# Patient Record
Sex: Female | Born: 1955 | Race: White | Hispanic: No | Smoking: Never smoker
Health system: Southern US, Community
[De-identification: ages and names within clinical notes are randomized; demographics above are authoritative.]

## PROBLEM LIST (undated history)

## (undated) DIAGNOSIS — F411 Generalized anxiety disorder: Secondary | ICD-10-CM

## (undated) DIAGNOSIS — F419 Anxiety disorder, unspecified: Secondary | ICD-10-CM

## (undated) DIAGNOSIS — L409 Psoriasis, unspecified: Secondary | ICD-10-CM

## (undated) DIAGNOSIS — Z789 Other specified health status: Secondary | ICD-10-CM

## (undated) DIAGNOSIS — M1990 Unspecified osteoarthritis, unspecified site: Secondary | ICD-10-CM

## (undated) DIAGNOSIS — T7840XA Allergy, unspecified, initial encounter: Secondary | ICD-10-CM

## (undated) DIAGNOSIS — F329 Major depressive disorder, single episode, unspecified: Secondary | ICD-10-CM

## (undated) DIAGNOSIS — K589 Irritable bowel syndrome without diarrhea: Secondary | ICD-10-CM

## (undated) DIAGNOSIS — E785 Hyperlipidemia, unspecified: Secondary | ICD-10-CM

## (undated) DIAGNOSIS — Z5189 Encounter for other specified aftercare: Secondary | ICD-10-CM

## (undated) HISTORY — DX: Allergy, unspecified, initial encounter: T78.40XA

## (undated) HISTORY — DX: Irritable bowel syndrome, unspecified: K58.9

## (undated) HISTORY — DX: Unspecified osteoarthritis, unspecified site: M19.90

## (undated) HISTORY — DX: Major depressive disorder, single episode, unspecified: F32.9

## (undated) HISTORY — DX: Generalized anxiety disorder: F41.1

## (undated) HISTORY — DX: Anxiety disorder, unspecified: F41.9

## (undated) HISTORY — PX: DILATION AND CURETTAGE OF UTERUS: SHX78

## (undated) HISTORY — DX: Hyperlipidemia, unspecified: E78.5

## (undated) HISTORY — DX: Psoriasis, unspecified: L40.9

## (undated) HISTORY — DX: Encounter for other specified aftercare: Z51.89

---

## 2006-03-19 ENCOUNTER — Ambulatory Visit (HOSPITAL_BASED_OUTPATIENT_CLINIC_OR_DEPARTMENT_OTHER): Admission: RE | Admit: 2006-03-19 | Discharge: 2006-03-19 | Payer: Self-pay | Admitting: Orthopedic Surgery

## 2011-09-08 ENCOUNTER — Encounter (HOSPITAL_BASED_OUTPATIENT_CLINIC_OR_DEPARTMENT_OTHER): Payer: Self-pay | Admitting: *Deleted

## 2011-09-10 ENCOUNTER — Other Ambulatory Visit: Payer: Self-pay | Admitting: Orthopedic Surgery

## 2011-09-10 NOTE — H&P (Signed)
  [name] [name] is an 56 y.o. female.   Chief Complaint: c/o mass palm right hand. 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 She has had a slightly discolored reddish purple mass in the subcutaneous region in the mid carpal space that has been present for more than 5 years. This has gradually enlarged and now is extremely uncomfortable and intolerable at times due to a sense of itching. She vigorously tries to rub her palm to try and relieve her symptoms.     History reviewed. No pertinent past medical history.  Past Surgical History  Procedure Date  . Dilation and curettage of uterus     History reviewed. No pertinent family history. Social History:  reports that she has never smoked. She does not have any smokeless tobacco history on file. She reports that she drinks alcohol. She reports that she does not use illicit drugs.  Allergies: No Known Allergies  No current facility-administered medications on file as of .   No current outpatient prescriptions on file as of .    No results found for this or any previous visit (from the past 48 hour(s)).  No results found.   Pertinent items are noted in HPI.  Height 5\' 2"  (1.575 m), weight 63.504 kg (140 lb).  General appearance: alert, cooperative, appears stated age and no distress Head: atraumatic Neck: no adenopathy, no carotid bruit, no JVD, supple, symmetrical, trachea midline and thyroid not enlarged, symmetric, no tenderness/mass/nodules Resp: clear to auscultation bilaterally Cardio: regular rate and rhythm, S1, S2 normal, no murmur, click, rub or gallop GI: normal findings: bowel sounds normal Extremities:. Inspection of her hand reveals a 1 cm in diameter purplish red mass in the mid palmar space. 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    She had an MRI study with contrast at [facility]. Dr. Vear Clock reviewed the  films and was noncommittal with respect to diagnosis. She noted evidence of tenosynovitis in the hand flexor tendons and focal subcutaneous tissue bulge in the region of the marker without underlying discrete mass.  Pulses: 2+ and symmetric Skin: Skin color, texture, turgor normal. No rashes or lesions Neurologic: Grossly normal   Assessment/Plan Impression: Undefined mass right palm.  We will schedule her for incisional biopsy of a hemangioma and/or glomus tumor from this region. The surgery, after care, risks and benefits were described in detail. While we cannot guarantee any particular outcome with a palpable mass in my judgment we have clear indication to proceed with a biopsy at this time. Questions were invited and answered in detail.     [name],[name] Sun J 09/10/2011, 10:16 AM  H&P documentation: 09/11/2011  -History and Physical Reviewed  -Patient has been re-examined  -No change in the plan of care  Wyn Forster, MD

## 2011-09-11 ENCOUNTER — Ambulatory Visit (HOSPITAL_BASED_OUTPATIENT_CLINIC_OR_DEPARTMENT_OTHER): Payer: BC Managed Care – PPO | Admitting: Anesthesiology

## 2011-09-11 ENCOUNTER — Ambulatory Visit (HOSPITAL_BASED_OUTPATIENT_CLINIC_OR_DEPARTMENT_OTHER)
Admission: RE | Admit: 2011-09-11 | Discharge: 2011-09-11 | Disposition: A | Discharge status: Home / Self Care | Payer: BC Managed Care – PPO | Source: Ambulatory Visit | Attending: Orthopedic Surgery | Admitting: Orthopedic Surgery

## 2011-09-11 ENCOUNTER — Encounter (HOSPITAL_BASED_OUTPATIENT_CLINIC_OR_DEPARTMENT_OTHER): Payer: Self-pay | Admitting: *Deleted

## 2011-09-11 ENCOUNTER — Encounter (HOSPITAL_BASED_OUTPATIENT_CLINIC_OR_DEPARTMENT_OTHER)
Admission: RE | Disposition: A | Discharge status: Home / Self Care | Payer: Self-pay | Source: Ambulatory Visit | Attending: Orthopedic Surgery

## 2011-09-11 ENCOUNTER — Encounter (HOSPITAL_BASED_OUTPATIENT_CLINIC_OR_DEPARTMENT_OTHER): Payer: Self-pay | Admitting: Anesthesiology

## 2011-09-11 DIAGNOSIS — R229 Localized swelling, mass and lump, unspecified: Secondary | ICD-10-CM | POA: Insufficient documentation

## 2011-09-11 DIAGNOSIS — L299 Pruritus, unspecified: Secondary | ICD-10-CM | POA: Insufficient documentation

## 2011-09-11 HISTORY — PX: MASS EXCISION: SHX2000

## 2011-09-11 HISTORY — DX: Other specified health status: Z78.9

## 2011-09-11 SURGERY — EXCISION MASS
Anesthesia: Regional | Site: Hand | Laterality: Right | Wound class: Clean

## 2011-09-11 MED ORDER — ONDANSETRON HCL 4 MG/2ML IJ SOLN: 4.0000 mg | Freq: Four times a day (QID) | INTRAMUSCULAR | Status: DC | PRN

## 2011-09-11 MED ORDER — LIDOCAINE HCL (PF) 2 % IJ SOLN
INTRAMUSCULAR | Status: DC | PRN
  Administered 2011-09-11: 3 mL

## 2011-09-11 MED ORDER — FENTANYL CITRATE 0.05 MG/ML IJ SOLN
INTRAMUSCULAR | Status: DC | PRN
  Administered 2011-09-11: 50 ug via INTRAVENOUS
  Administered 2011-09-11: 25 ug via INTRAVENOUS

## 2011-09-11 MED ORDER — CHLORHEXIDINE GLUCONATE 4 % EX LIQD: 60.0000 mL | Freq: Once | CUTANEOUS | Status: DC

## 2011-09-11 MED ORDER — DEXAMETHASONE SODIUM PHOSPHATE 4 MG/ML IJ SOLN
INTRAMUSCULAR | Status: DC | PRN
  Administered 2011-09-11: 10 mg via INTRAVENOUS

## 2011-09-11 MED ORDER — HYDROMORPHONE HCL PF 1 MG/ML IJ SOLN: 0.2500 mg | INTRAMUSCULAR | Status: DC | PRN

## 2011-09-11 MED ORDER — LACTATED RINGERS IV SOLN
INTRAVENOUS | Status: DC
  Administered 2011-09-11: 08:00:00 via INTRAVENOUS

## 2011-09-11 MED ORDER — HYDROCODONE-ACETAMINOPHEN 5-500 MG PO TABS: 1.0000 | ORAL_TABLET | Freq: Four times a day (QID) | ORAL | Status: AC | PRN

## 2011-09-11 MED ORDER — MIDAZOLAM HCL 5 MG/5ML IJ SOLN
INTRAMUSCULAR | Status: DC | PRN
  Administered 2011-09-11: 1 mg via INTRAVENOUS

## 2011-09-11 MED ORDER — PROPOFOL 10 MG/ML IV EMUL
INTRAVENOUS | Status: DC | PRN
  Administered 2011-09-11: 200 mg via INTRAVENOUS

## 2011-09-11 MED ORDER — ONDANSETRON HCL 4 MG/2ML IJ SOLN
INTRAMUSCULAR | Status: DC | PRN
  Administered 2011-09-11: 4 mg via INTRAVENOUS

## 2011-09-11 MED ORDER — LIDOCAINE HCL (CARDIAC) 20 MG/ML IV SOLN
INTRAVENOUS | Status: DC | PRN
  Administered 2011-09-11: 100 mg via INTRAVENOUS

## 2011-09-11 SURGICAL SUPPLY — 53 items
BANDAGE ADHESIVE 1X3 (GAUZE/BANDAGES/DRESSINGS) IMPLANT
BANDAGE ELASTIC 3 VELCRO ST LF (GAUZE/BANDAGES/DRESSINGS) ×1 IMPLANT
BLADE MINI RND TIP GREEN BEAV (BLADE) IMPLANT
BLADE SURG 15 STRL LF DISP TIS (BLADE) ×1 IMPLANT
BLADE SURG 15 STRL SS (BLADE) ×2
BNDG CMPR 9X4 STRL LF SNTH (GAUZE/BANDAGES/DRESSINGS) ×1
BNDG CMPR MD 5X2 ELC HKLP STRL (GAUZE/BANDAGES/DRESSINGS) ×1
BNDG COHESIVE 1X5 TAN STRL LF (GAUZE/BANDAGES/DRESSINGS) ×1 IMPLANT
BNDG ELASTIC 2 VLCR STRL LF (GAUZE/BANDAGES/DRESSINGS) ×1 IMPLANT
BNDG ESMARK 4X9 LF (GAUZE/BANDAGES/DRESSINGS) ×1 IMPLANT
BRUSH SCRUB EZ PLAIN DRY (MISCELLANEOUS) ×2 IMPLANT
CLOTH BEACON ORANGE TIMEOUT ST (SAFETY) ×2 IMPLANT
CORDS BIPOLAR (ELECTRODE) ×2 IMPLANT
COVER MAYO STAND STRL (DRAPES) ×2 IMPLANT
COVER TABLE BACK 60X90 (DRAPES) ×2 IMPLANT
CUFF TOURNIQUET SINGLE 18IN (TOURNIQUET CUFF) ×1 IMPLANT
DECANTER SPIKE VIAL GLASS SM (MISCELLANEOUS) ×2 IMPLANT
DRAIN PENROSE 1/2X12 LTX STRL (WOUND CARE) IMPLANT
DRAIN PENROSE 1/4X12 LTX STRL (WOUND CARE) IMPLANT
DRAPE EXTREMITY T 121X128X90 (DRAPE) ×2 IMPLANT
DRAPE SURG 17X23 STRL (DRAPES) ×2 IMPLANT
GAUZE XEROFORM 1X8 LF (GAUZE/BANDAGES/DRESSINGS) ×1 IMPLANT
GLOVE BIO SURGEON STRL SZ7 (GLOVE) ×2 IMPLANT
GLOVE BIOGEL PI IND STRL 7.0 (GLOVE) IMPLANT
GLOVE BIOGEL PI IND STRL 8 (GLOVE) ×1 IMPLANT
GLOVE BIOGEL PI INDICATOR 7.0 (GLOVE) ×1
GLOVE BIOGEL PI INDICATOR 8 (GLOVE) ×1
GLOVE ORTHO TXT STRL SZ7.5 (GLOVE) ×2 IMPLANT
GOWN PREVENTION PLUS XLARGE (GOWN DISPOSABLE) ×2 IMPLANT
GOWN STRL REIN XL XLG (GOWN DISPOSABLE) ×4 IMPLANT
NDL BLUNT 17GA (NEEDLE) ×1 IMPLANT
NEEDLE 27GAX1X1/2 (NEEDLE) ×1 IMPLANT
NEEDLE BLUNT 17GA (NEEDLE) IMPLANT
PACK BASIN DAY SURGERY FS (CUSTOM PROCEDURE TRAY) ×2 IMPLANT
PAD CAST 3X4 CTTN HI CHSV (CAST SUPPLIES) ×1 IMPLANT
PADDING CAST COTTON 3X4 STRL (CAST SUPPLIES) ×2
PADDING UNDERCAST 2  STERILE (CAST SUPPLIES) IMPLANT
SPLINT PLASTER CAST XFAST 3X15 (CAST SUPPLIES) IMPLANT
SPLINT PLASTER XTRA FASTSET 3X (CAST SUPPLIES)
SPONGE GAUZE 4X4 12PLY (GAUZE/BANDAGES/DRESSINGS) IMPLANT
STOCKINETTE 4X48 STRL (DRAPES) ×2 IMPLANT
STRIP CLOSURE SKIN 1/2X4 (GAUZE/BANDAGES/DRESSINGS) IMPLANT
SUT ETHILON 5 0 P 3 18 (SUTURE) ×1
SUT MERSILENE 4 0 P 3 (SUTURE) IMPLANT
SUT NYLON ETHILON 5-0 P-3 1X18 (SUTURE) ×1 IMPLANT
SUT PROLENE 3 0 PS 2 (SUTURE) IMPLANT
SYR 20CC LL (SYRINGE) IMPLANT
SYR 3ML 23GX1 SAFETY (SYRINGE) IMPLANT
SYR CONTROL 10ML LL (SYRINGE) ×1 IMPLANT
TOWEL OR 17X24 6PK STRL BLUE (TOWEL DISPOSABLE) ×3 IMPLANT
TRAY DSU PREP LF (CUSTOM PROCEDURE TRAY) ×2 IMPLANT
UNDERPAD 30X30 INCONTINENT (UNDERPADS AND DIAPERS) ×2 IMPLANT
WATER STERILE IRR 1000ML POUR (IV SOLUTION) IMPLANT

## 2011-09-11 NOTE — Brief Op Note (Signed)
09/11/2011  8:20 AM  PATIENT:  [name] [name]  56 y.o. female  PRE-OPERATIVE DIAGNOSIS:  lump in right palm, vascular and intensely puritic  POST-OPERATIVE DIAGNOSIS:  lump in right palm, rule out glomus tumor variant  PROCEDURE:  EXCISIONAL BIOPSY VASCULAR LESION RIGHT PALM   SURGEON:  Wyn Forster., MD   PHYSICIAN ASSISTANT:   ASSISTANTS: none   ANESTHESIA:   general  EBL:     BLOOD ADMINISTERED:none  DRAINS: none   LOCAL MEDICATIONS USED:  XYLOCAINE   SPECIMEN:  Excision  DISPOSITION OF SPECIMEN:  PATHOLOGY  COUNTS:  YES  TOURNIQUET:   Total Tourniquet Time Documented: Upper Arm (Right) - 22 minutes  DICTATION: .Other Dictation: Dictation Number [id number]  PLAN OF CARE: Discharge to home after PACU  PATIENT DISPOSITION:  PACU - hemodynamically stable.

## 2011-09-11 NOTE — Anesthesia Postprocedure Evaluation (Signed)
Anesthesia Post Note  Patient: [name] [name]  Procedure(s) Performed: Procedure(s) (LRB): EXCISION MASS (Right)  Anesthesia type: General  Patient location: PACU  Post pain: Pain level controlled and Adequate analgesia  Post assessment: Post-op Vital signs reviewed, Patient's Cardiovascular Status Stable, Respiratory Function Stable, Patent Airway and Pain level controlled  Last Vitals:  Filed Vitals:   09/11/11 0830  BP: 143/80  Pulse: 71  Temp:   Resp: 13    Post vital signs: Reviewed and stable  Level of consciousness: awake, alert  and oriented  Complications: No apparent anesthesia complications

## 2011-09-11 NOTE — Discharge Instructions (Addendum)

## 2011-09-11 NOTE — Op Note (Signed)
OP NOTE DICTATED 09/11/11 [id number]

## 2011-09-11 NOTE — Anesthesia Procedure Notes (Signed)
Procedure Name: LMA Insertion Date/Time: 09/11/2011 7:45 AM Performed by: Gar Gibbon Pre-anesthesia Checklist: Patient identified, Emergency Drugs available, Suction available and Patient being monitored Patient Re-evaluated:Patient Re-evaluated prior to inductionOxygen Delivery Method: Circle system utilized Preoxygenation: Pre-oxygenation with 100% oxygen Intubation Type: IV induction LMA: LMA inserted LMA Size: 4.0 Number of attempts: 1 Tube secured with: Tape Dental Injury: Teeth and Oropharynx as per pre-operative assessment

## 2011-09-11 NOTE — Transfer of Care (Signed)
Immediate Anesthesia Transfer of Care Note  Patient: [name] [name]  Procedure(s) Performed: Procedure(s) (LRB): EXCISION MASS (Right)  Patient Location: PACU  Anesthesia Type: General  Level of Consciousness: alert   Airway & Oxygen Therapy: Patient Spontanous Breathing  Post-op Assessment: Report given to PACU RN  Post vital signs: Reviewed and stable  Complications: No apparent anesthesia complications

## 2011-09-11 NOTE — Anesthesia Preprocedure Evaluation (Signed)
Anesthesia Evaluation  Patient identified by MRN, date of birth, ID band Patient awake    Reviewed: Allergy & Precautions, H&P , NPO status , Patient's Chart, lab work & pertinent test results  Airway Mallampati: II  Neck ROM: full    Dental   Pulmonary          Cardiovascular     Neuro/Psych    GI/Hepatic   Endo/Other    Renal/GU      Musculoskeletal   Abdominal   Peds  Hematology   Anesthesia Other Findings   Reproductive/Obstetrics                           Anesthesia Physical Anesthesia Plan  ASA: I  Anesthesia Plan: Bier Block   Post-op Pain Management:    Induction:   Airway Management Planned:   Additional Equipment:   Intra-op Plan:   Post-operative Plan:   Informed Consent: I have reviewed the patients History and Physical, chart, labs and discussed the procedure including the risks, benefits and alternatives for the proposed anesthesia with the patient or authorized representative who has indicated his/her understanding and acceptance.     Plan Discussed with: CRNA and Surgeon  Anesthesia Plan Comments:         Anesthesia Quick Evaluation

## 2011-09-11 NOTE — Op Note (Signed)
[name], [name]                 ACCOUNT NO.:  [id number]  MEDICAL RECORD NO.:  [id number]  LOCATION:                                 FACILITY:  PHYSICIAN:  Katy Fitch. Vaudie Engebretsen, M.D.      DATE OF BIRTH:  DATE OF PROCEDURE:  09/11/2011 DATE OF DISCHARGE:                              OPERATIVE REPORT   PREOPERATIVE DIAGNOSIS:  Intensely pruritic mass, right hand mid palm, deep to palmar fascia, with a negative MRI contrast study preoperatively and consistent physical findings for more than 3 months.  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  (The mass was sent in formalin and labeled with a suture for pathologic evaluation).  OPERATING SURGEON:  Katy Fitch. Oluwadamilare Tobler, MD  No assistant.  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  She presented for evaluation of a very bothersome right palm predicament several months prior.  She had an area of firmness in her palm that at times had a bluish red cast on direct inspection.  On palpation, it was quite sensitive to touch and caused intense itching.  Clinical examination was challenging.  There was a vague sense of fullness that appeared to be deep to the palmar fascia.  With dependency, this did not substantially change, it was not pulsatile. She had no history of penetrating foreign body.  Radiographs were unrevealing.  Due to the intensity of her symptoms, we recommended an MRI with contrast looking for possible glomus tumor.  This was accomplished at [facility]; however, it was felt to be nondiagnostic by the attending radiologist.  I reviewed the MRI and felt that there was an area of hypervascularity that was quite small, deep to  the fascia.  After detailed informed consent, we advised [name] [name], we would be able to explore this area but did not guarantee any specific outcome.  In the absence of an imaging study to guide [place], a biopsy in this region is done as an act of faith with a therapeutic alliance with our patient.  She requested that we proceed with exploration of her palm at this time and appropriate intervention as our findings dictate.  Preoperatively, she was interviewed by Dr. Chaney Malling.  IV access was attempted.  This was a bit challenging due to her being apprehensive with high catecholamines.  She had a very traumatic experience having an IV started, which could be a prognostic sign.  After informed consent, she was brought to the operating room at this time.  PROCEDURE:  [name] [name] was brought to room 6 of the [facility] and placed in supine position on the operating table.  Following the induction of general anesthesia, under Dr. Seward Meth direct supervision, the right arm was prepped with Betadine soap solution, sterilely draped.  A pneumatic tourniquet was applied to proximal right brachium.  Prophylactic antibiotics were deferred.  After routine surgical time-out, the right hand and arm were exsanguinated  with an Esmarch bandage and arterial tourniquet was inflated 220 mmHg. The operative site had been meticulously marked in the holding area preoperatively.  We created an elliptical incision in the thenar crease and elevated the skin over the area of the mass.  Subcutaneous tissues were unremarkable.  The palmar fascia was slightly hypertrophic but otherwise normal.  The palmar fascia and the subdermal tissues in this region were excised so as to possibly remove any small neuroma that could have been created from prior trauma.  With careful dissection of the mid palmar fat, we identified a 3 x 3-mm, highly vascular, pinkish orange lesion that had a feeding vessel and  a small nerve attached to it.  We also found 1 rather large pacinian corpuscle.  Both were excised.  The vascular mass was marked with a 5-0 nylon suture and placed in formalin.  The wound was meticulously inspected.  We examined the palmar arch.  The common digital vessels to the index, long, and ring fingers, and the region of the common digital nerves.  We could not identify any other lesions.  The wound was then inspected for bleeding points which were electrocauterized with bipolar current, followed by repair of the skin with trauma sutures of 5-0 nylon.  The wound was dressed with a Xeroflo sterile gauze, and an Ace bandage dressing and 3 mL of 2% lidocaine were infiltrated around the wound for postoperative analgesia.  There were no apparent complications.  For aftercare, [name] [name] was transferred to the post anesthesia care unit.  She will be discharged to the care of her family with a prescription for Vicodin 5 mg 1 p.o. q.4-6 hours p.r.n. pain, 20 tablets, without refill.  We will see her back for followup in our office in 1 week.     [name], M.D.    RVS/MEDQ  D:  09/11/2011  T:  09/11/2011  Job:  [id number]

## 2011-09-15 ENCOUNTER — Encounter (HOSPITAL_BASED_OUTPATIENT_CLINIC_OR_DEPARTMENT_OTHER): Payer: Self-pay | Admitting: Orthopedic Surgery

## 2012-08-02 DIAGNOSIS — L719 Rosacea, unspecified: Secondary | ICD-10-CM | POA: Insufficient documentation

## 2012-08-16 DIAGNOSIS — J209 Acute bronchitis, unspecified: Secondary | ICD-10-CM | POA: Insufficient documentation

## 2014-01-28 DIAGNOSIS — M21969 Unspecified acquired deformity of unspecified lower leg: Secondary | ICD-10-CM | POA: Insufficient documentation

## 2014-01-28 DIAGNOSIS — N951 Menopausal and female climacteric states: Secondary | ICD-10-CM | POA: Insufficient documentation

## 2014-01-28 DIAGNOSIS — M7750 Other enthesopathy of unspecified foot: Secondary | ICD-10-CM | POA: Insufficient documentation

## 2014-01-28 DIAGNOSIS — K649 Unspecified hemorrhoids: Secondary | ICD-10-CM | POA: Insufficient documentation

## 2014-02-17 DIAGNOSIS — R059 Cough, unspecified: Secondary | ICD-10-CM | POA: Insufficient documentation

## 2014-10-12 DIAGNOSIS — R143 Flatulence: Secondary | ICD-10-CM | POA: Insufficient documentation

## 2014-10-12 DIAGNOSIS — G549 Nerve root and plexus disorder, unspecified: Secondary | ICD-10-CM | POA: Insufficient documentation

## 2014-11-09 DIAGNOSIS — H6693 Otitis media, unspecified, bilateral: Secondary | ICD-10-CM | POA: Insufficient documentation

## 2014-12-25 DIAGNOSIS — G5601 Carpal tunnel syndrome, right upper limb: Secondary | ICD-10-CM | POA: Insufficient documentation

## 2014-12-25 DIAGNOSIS — G5622 Lesion of ulnar nerve, left upper limb: Secondary | ICD-10-CM | POA: Insufficient documentation

## 2015-04-05 DIAGNOSIS — L989 Disorder of the skin and subcutaneous tissue, unspecified: Secondary | ICD-10-CM | POA: Insufficient documentation

## 2015-04-05 DIAGNOSIS — L209 Atopic dermatitis, unspecified: Secondary | ICD-10-CM | POA: Insufficient documentation

## 2015-06-22 DIAGNOSIS — L03011 Cellulitis of right finger: Secondary | ICD-10-CM | POA: Insufficient documentation

## 2015-06-22 DIAGNOSIS — M79644 Pain in right finger(s): Secondary | ICD-10-CM | POA: Insufficient documentation

## 2015-07-19 DIAGNOSIS — M25511 Pain in right shoulder: Secondary | ICD-10-CM | POA: Insufficient documentation

## 2015-11-09 DIAGNOSIS — R030 Elevated blood-pressure reading, without diagnosis of hypertension: Secondary | ICD-10-CM | POA: Insufficient documentation

## 2015-12-05 DIAGNOSIS — M542 Cervicalgia: Secondary | ICD-10-CM | POA: Insufficient documentation

## 2015-12-05 DIAGNOSIS — M545 Low back pain, unspecified: Secondary | ICD-10-CM | POA: Insufficient documentation

## 2015-12-27 DIAGNOSIS — J00 Acute nasopharyngitis [common cold]: Secondary | ICD-10-CM | POA: Insufficient documentation

## 2016-08-29 DIAGNOSIS — J301 Allergic rhinitis due to pollen: Secondary | ICD-10-CM | POA: Insufficient documentation

## 2016-08-29 DIAGNOSIS — J3081 Allergic rhinitis due to animal (cat) (dog) hair and dander: Secondary | ICD-10-CM | POA: Insufficient documentation

## 2016-12-09 DIAGNOSIS — S62627A Displaced fracture of medial phalanx of left little finger, initial encounter for closed fracture: Secondary | ICD-10-CM | POA: Insufficient documentation

## 2017-02-23 DIAGNOSIS — R1084 Generalized abdominal pain: Secondary | ICD-10-CM | POA: Insufficient documentation

## 2017-03-06 DIAGNOSIS — B9681 Helicobacter pylori [H. pylori] as the cause of diseases classified elsewhere: Secondary | ICD-10-CM | POA: Insufficient documentation

## 2017-03-06 DIAGNOSIS — R1013 Epigastric pain: Secondary | ICD-10-CM | POA: Insufficient documentation

## 2017-04-22 HISTORY — PX: ESOPHAGOGASTRODUODENOSCOPY: SHX1529

## 2017-07-29 DIAGNOSIS — R0981 Nasal congestion: Secondary | ICD-10-CM | POA: Insufficient documentation

## 2017-07-29 DIAGNOSIS — R519 Headache, unspecified: Secondary | ICD-10-CM | POA: Insufficient documentation

## 2017-08-25 DIAGNOSIS — M722 Plantar fascial fibromatosis: Secondary | ICD-10-CM | POA: Insufficient documentation

## 2017-08-25 DIAGNOSIS — M7731 Calcaneal spur, right foot: Secondary | ICD-10-CM | POA: Insufficient documentation

## 2017-08-25 DIAGNOSIS — M6701 Short Achilles tendon (acquired), right ankle: Secondary | ICD-10-CM | POA: Insufficient documentation

## 2017-09-08 ENCOUNTER — Encounter: Payer: Self-pay | Admitting: Gastroenterology

## 2017-10-22 ENCOUNTER — Encounter: Payer: Self-pay | Admitting: Gastroenterology

## 2017-10-27 ENCOUNTER — Ambulatory Visit (INDEPENDENT_AMBULATORY_CARE_PROVIDER_SITE_OTHER): Payer: BLUE CROSS/BLUE SHIELD | Admitting: Gastroenterology

## 2017-10-27 ENCOUNTER — Telehealth: Payer: Self-pay

## 2017-10-27 ENCOUNTER — Encounter: Payer: Self-pay | Admitting: Gastroenterology

## 2017-10-27 ENCOUNTER — Telehealth: Payer: Self-pay | Admitting: Gastroenterology

## 2017-10-27 VITALS — BP 136/84 | HR 62 | Ht 62.0 in | Wt 151.2 lb

## 2017-10-27 DIAGNOSIS — Z1211 Encounter for screening for malignant neoplasm of colon: Secondary | ICD-10-CM | POA: Diagnosis not present

## 2017-10-27 DIAGNOSIS — R197 Diarrhea, unspecified: Secondary | ICD-10-CM | POA: Diagnosis not present

## 2017-10-27 MED ORDER — SOD PICOSULFATE-MAG OX-CIT ACD 10-3.5-12 MG-GM -GM/160ML PO SOLN: 1.0000 | Freq: Once | ORAL | 0 refills | Status: AC

## 2017-10-27 NOTE — Telephone Encounter (Signed)
lmtcb

## 2017-10-27 NOTE — Telephone Encounter (Signed)
She has had diarrhea for the past 7-10 days.  She saw her  PCP last Friday and some lab work was done but we do not have those results.  PCP started Flagyl which has helped, stools are more formed but not back to normal.  Appointment made with Dr Chales Abrahams for this afternoon.

## 2017-10-27 NOTE — Patient Instructions (Signed)
If you are age 60 or older, your body mass index should be between 23-30. Your Body mass index is 27.66 kg/m. If this is out of the aforementioned range listed, please consider follow up with your Primary Care Provider.  If you are age 27 or younger, your body mass index should be between 19-25. Your Body mass index is 27.66 kg/m. If this is out of the aformentioned range listed, please consider follow up with your Primary Care Provider.   We have sent the following medications to your pharmacy for you to pick up at your convenience:  Bentyl 10 mg by mouth twice daily.   Please have stool studies done with Kieth Brightly.   You have been scheduled for a colonoscopy. Please follow written instructions given to you at your visit today.  Please pick up your prep supplies at the pharmacy within the next 1-3 days. If you use inhalers (even only as needed), please bring them with you on the day of your procedure. Your physician has requested that you go to www.startemmi.com and enter the access code given to you at your visit today. This web site gives a general overview about your procedure. However, you should still follow specific instructions given to you by our office regarding your preparation for the procedure.] Thank you,  Dr. Lynann Bologna

## 2017-10-27 NOTE — Progress Notes (Signed)
Chief Complaint: FU  Referring Provider:  Syble Creek FNP      ASSESSMENT AND PLAN;   #1. Diarrhea (resolved with Flagyl)  #2. Screening/survillance - colon at [facility] (Dr Noe Gens) 2013 - ? Small polyps, poor prep per patient.  Plan: Bentyl 10 mg p.o. twice daily for history suggestive of colonic spasms.  Patient likely had acute infectious colitis.  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 Finish the course of Flagyl. Follow stool studies -not back yet. I have recommended colonoscopy in 4 to 6 weeks.  I discussed risks and benefits in detail.  This is also been performed for colorectal cancer screening/surveillance.     HPI:    [name] [name] is a 62 y.o. female  For Follow-up Diarrhea -7-8 loose watery stools without blood x 10 days, stool studies pending, treated empirically with Flagyl with good results.  No melena or hematochezia. Lower abdominal pain with bloating.  The pain gets relieved on defecation.  Past GI procedures: -Patient underwent CT scan of the abdomen pelvis 02/22/2017 which showed mild thickening of gastric antrum. -EGD 04/22/2017 showed erosive gastritis with negative biopsies for H. Pylori. -Colonoscopy: at [facility] (Dr Noe Gens) 2013 - ? Small polyps, poor prep per patient.    Past Medical History:  Diagnosis Date  . No pertinent past medical history     Past Surgical History:  Procedure Laterality Date  . DILATION AND CURETTAGE OF UTERUS    . MASS EXCISION  09/11/2011   Procedure: EXCISION MASS;  Surgeon: Wyn Forster., MD;  Location: [facility];  Service: Orthopedics;  Laterality: Right;  excisional biopsy right hand     History reviewed. No pertinent family history.  Social History   Tobacco Use  . Smoking status: Never Smoker  . Smokeless tobacco: Never Used  Substance Use Topics  . Alcohol use: Yes    Alcohol/week: 8.4 oz    Types: 14 Cans of beer per week    Comment: SOCIAL  . Drug use: No    Current  Outpatient Medications  Medication Sig Dispense Refill  . acyclovir (ZOVIRAX) 400 MG tablet Take 800 mg by mouth at bedtime.    . ALPRAZolam (XANAX) 1 MG tablet Take 0.5 mg by mouth at bedtime as needed for anxiety.    . Cholecalciferol (VITAMIN D3) 1000 units CAPS Take 1,000 Units by mouth daily at 10 pm.    . escitalopram (LEXAPRO) 20 MG tablet Take 30 mg by mouth at bedtime.    . fexofenadine (ALLEGRA) 180 MG tablet Take 180 mg by mouth daily.    . metroNIDAZOLE (FLAGYL) 250 MG tablet Take 250 mg by mouth 3 (three) times daily.    . pantoprazole (PROTONIX) 20 MG tablet Take 20 mg by mouth daily.    . rosuvastatin (CRESTOR) 5 MG tablet Take 5 mg by mouth daily.     No current facility-administered medications for this visit.     No Known Allergies  Review of Systems:  Constitutional: Denies fever, chills, diaphoresis, appetite change and fatigue.  HEENT: Denies photophobia, eye pain, redness, hearing loss, ear pain, congestion, sore throat, rhinorrhea, sneezing, mouth sores, neck pain, neck stiffness and tinnitus.   Respiratory: Denies SOB, DOE, cough, chest tightness,  and wheezing.   Cardiovascular: Denies chest pain, palpitations and leg swelling.  Genitourinary: Denies dysuria, urgency, frequency, hematuria, flank pain and difficulty urinating.  Musculoskeletal: Denies myalgias, back pain, joint swelling, arthralgias and gait problem.  Skin: No rash.  Neurological: Denies  dizziness, seizures, syncope, weakness, light-headedness, numbness and headaches.  Hematological: Denies adenopathy. Easy bruising, personal or family bleeding history  Psychiatric/Behavioral: No anxiety or depression     Physical Exam:    BP 136/84   Pulse 62   Ht  (1.575 m)   Wt 151 lb 4 oz (68.6 kg)   BMI 27.66 kg/m  Filed Weights   10/27/17 1415  Weight: 151 lb 4 oz (68.6 kg)   Constitutional:  Well-developed, in no acute distress. Psychiatric: Normal mood and affect. Behavior is  normal. HEENT: Pupils normal.  Conjunctivae are normal. No scleral icterus. Neck supple.  Cardiovascular: Normal rate, regular rhythm. No edema Pulmonary/chest: Effort normal and breath sounds normal. No wheezing, rales or rhonchi. Abdominal: Soft, nondistended. Nontender. Bowel sounds active throughout. There are no masses palpable. No hepatomegaly. Rectal:  defered Neurological: Alert and oriented to person place and time. Skin: Skin is warm and dry. No rashes noted.   Edman Circle, MD 10/27/2017, 2:43 PM  Cc: Syble Creek FNP

## 2017-11-02 ENCOUNTER — Telehealth: Payer: Self-pay | Admitting: Gastroenterology

## 2017-11-03 MED ORDER — DICYCLOMINE HCL 10 MG PO CAPS: 10.0000 mg | ORAL_CAPSULE | Freq: Two times a day (BID) | ORAL | 3 refills | Status: DC

## 2017-11-03 NOTE — Telephone Encounter (Signed)
Sent medication to patients pharmacy.  

## 2017-11-17 ENCOUNTER — Ambulatory Visit: Payer: Self-pay | Admitting: Gastroenterology

## 2017-12-10 ENCOUNTER — Telehealth: Payer: Self-pay | Admitting: Gastroenterology

## 2017-12-10 NOTE — Telephone Encounter (Signed)
Patient states prep for colon was not covered by insurance and another prep was not sent to pharmacy. Pt had ov on 5.14.19 and her colon is scheduled for 7.10.19.

## 2017-12-11 ENCOUNTER — Encounter: Payer: Self-pay | Admitting: Gastroenterology

## 2017-12-14 MED ORDER — SOD PICOSULFATE-MAG OX-CIT ACD 10-3.5-12 MG-GM -GM/160ML PO SOLN: 1.0000 | ORAL | 0 refills | Status: DC

## 2017-12-14 NOTE — Telephone Encounter (Signed)
Called patient and let her know to come by the office for a sample of her prep (clenpiq)

## 2017-12-23 ENCOUNTER — Encounter: Payer: Self-pay | Admitting: Gastroenterology

## 2017-12-23 ENCOUNTER — Ambulatory Visit (AMBULATORY_SURGERY_CENTER): Payer: BLUE CROSS/BLUE SHIELD | Admitting: Gastroenterology

## 2017-12-23 ENCOUNTER — Other Ambulatory Visit: Payer: Self-pay

## 2017-12-23 VITALS — BP 120/78 | HR 64 | Temp 97.8°F | Resp 15 | Ht 62.0 in | Wt 151.0 lb

## 2017-12-23 DIAGNOSIS — R197 Diarrhea, unspecified: Secondary | ICD-10-CM | POA: Diagnosis present

## 2017-12-23 HISTORY — PX: COLONOSCOPY: SHX174

## 2017-12-23 MED ORDER — SODIUM CHLORIDE 0.9 % IV SOLN: 500.0000 mL | INTRAVENOUS | Status: DC

## 2017-12-23 NOTE — Progress Notes (Signed)
Called to room to assist during endoscopic procedure.  Patient ID and intended procedure confirmed with present staff. Received instructions for my participation in the procedure from the performing physician.  

## 2017-12-23 NOTE — Patient Instructions (Signed)
Discharge instructions given. Handout on hemorrhoids. No ibuprofen,naproxen, or other non-steroidal anti-inflammatory drugs. Resume previous medications. YOU HAD AN ENDOSCOPIC PROCEDURE TODAY AT THE [facility]:   Refer to the procedure report that was given to you for any specific questions about what was found during the examination.  If the procedure report does not answer your questions, please call your gastroenterologist to clarify.  If you requested that your care partner not be given the details of your procedure findings, then the procedure report has been included in a sealed envelope for you to review at your convenience later.  YOU SHOULD EXPECT: Some feelings of bloating in the abdomen. Passage of more gas than usual.  Walking can help get rid of the air that was put into your GI tract during the procedure and reduce the bloating. If you had a lower endoscopy (such as a colonoscopy or flexible sigmoidoscopy) you may notice spotting of blood in your stool or on the toilet paper. If you underwent a bowel prep for your procedure, you may not have a normal bowel movement for a few days.  Please Note:  You might notice some irritation and congestion in your nose or some drainage.  This is from the oxygen used during your procedure.  There is no need for concern and it should clear up in a day or so.  SYMPTOMS TO REPORT IMMEDIATELY:   Following lower endoscopy (colonoscopy or flexible sigmoidoscopy):  Excessive amounts of blood in the stool  Significant tenderness or worsening of abdominal pains  Swelling of the abdomen that is new, acute  Fever of 100F or higher   For urgent or emergent issues, a gastroenterologist can be reached at any hour by calling [phone].   DIET:  We do recommend a small meal at first, but then you may proceed to your regular diet.  Drink plenty of fluids but you should avoid alcoholic beverages for 24 hours.  ACTIVITY:  You should plan to  take it easy for the rest of today and you should NOT DRIVE or use heavy machinery until tomorrow (because of the sedation medicines used during the test).    FOLLOW UP: Our staff will call the number listed on your records the next business day following your procedure to check on you and address any questions or concerns that you may have regarding the information given to you following your procedure. If we do not reach you, we will leave a message.  However, if you are feeling well and you are not experiencing any problems, there is no need to return our call.  We will assume that you have returned to your regular daily activities without incident.  If any biopsies were taken you will be contacted by phone or by letter within the next 1-3 weeks.  Please call us at [phone] if you have not heard about the biopsies in 3 weeks.    SIGNATURES/CONFIDENTIALITY: You and/or your care partner have signed paperwork which will be entered into your electronic medical record.  These signatures attest to the fact that that the information above on your After Visit Summary has been reviewed and is understood.  Full responsibility of the confidentiality of this discharge information lies with you and/or your care-partner.

## 2017-12-23 NOTE — Op Note (Signed)
[facility] Patient Name: [name] [name] Procedure Date: 12/23/2017 8:33 AM MRN: [id number] Endoscopist: Jackquline Denmark , MD Age: 62 Referring MD:  Date of Birth: February 18, 1956 Gender: Female Account #: [id number] Procedure:                Colonoscopy Indications:              Screening for colorectal malignant neoplasm,                            Incidental - H/O diarrhea. Medicines:                Monitored Anesthesia Care Procedure:                Pre-Anesthesia Assessment:                           - Prior to the procedure, a History and Physical                            was performed, and patient medications and                            allergies were reviewed. The patient is competent.                            The risks and benefits of the procedure and the                            sedation options and risks were discussed with the                            patient. All questions were answered and informed                            consent was obtained. Patient identification and                            proposed procedure were verified by the physician                            in the procedure room. Mental Status Examination:                            alert and oriented. Prophylactic Antibiotics: The                            patient does not require prophylactic antibiotics.                            Prior Anticoagulants: The patient has taken no                            previous anticoagulant or antiplatelet agents. ASA  Grade Assessment: II - A patient with mild systemic                            disease. After reviewing the risks and benefits,                            the patient was deemed in satisfactory condition to                            undergo the procedure. The anesthesia plan was to                            use monitored anesthesia care (MAC). Immediately                            prior to administration of  medications, the patient                            was re-assessed for adequacy to receive sedatives.                            The heart rate, respiratory rate, oxygen                            saturations, blood pressure, adequacy of pulmonary                            ventilation, and response to care were monitored                            throughout the procedure. The physical status of                            the patient was re-assessed after the procedure.                           After obtaining informed consent, the colonoscope                            was passed under direct vision. Throughout the                            procedure, the patient's blood pressure, pulse, and                            oxygen saturations were monitored continuously. The                            Model PCF-H190DL [id number]) scope was introduced                            through the anus and advanced to the the cecum,  identified by appendiceal orifice and ileocecal                            valve. The colonoscopy was performed without                            difficulty. The patient tolerated the procedure                            well. The quality of the bowel preparation was fair. Scope In: 8:48:50 AM Scope Out: 9:02:33 AM Scope Withdrawal Time: 0 hours 9 minutes 55 seconds  Total Procedure Duration: 0 hours 13 minutes 43 seconds  Findings:                 Non-bleeding internal hemorrhoids were found during                            retroflexion and rectal examination. The                            hemorrhoids were small and Grade II (internal                            hemorrhoids that prolapse but reduce spontaneously).                           The mucosa vascular pattern in the entire colon was                            normal. Biopsies were taken with a cold forceps for                            histology to rule out microscopic colitis.  Quality                            of preparation did limit the examination. GI could                            not be intubated partly due to the anatomy and also                            due to retained solid vegetable material in the                            cecum. Complications:            No immediate complications. Estimated Blood Loss:     Estimated blood loss: none. Impression:               - Internal hemorrhoids.                           - Otherwise grossly normal colonoscopy. Recommendation:           - Patient has a contact number available for  emergencies. The signs and symptoms of potential                            delayed complications were discussed with the                            patient. Return to normal activities tomorrow.                            Written discharge instructions were provided to the                            patient.                           - High fiber diet.                           - Preparation H 1 twice a day after the bowel                            movement for 7-10 days. If she has any further                            anorectal problems, would consider surgical                            evaluation for examination under anesthesia and                            hemorrhoidectomy. I do not think that these                            hemorrhods are amenable to banding.                           - No ibuprofen, naproxen, or other non-steroidal                            anti-inflammatory drugs.                           - Await pathology results.                           - Repeat colonoscopy in 3 years for screening                            purposes. Earlier,if she starts having any new                            problems.                           - Return to GI office PRN. Jackquline Denmark, MD 12/23/2017 9:14:50 AM This report has been signed electronically.

## 2017-12-23 NOTE — Progress Notes (Signed)
Report to RN, VSS, adequate respirations noted, no c/o pain or discomfort 

## 2017-12-24 ENCOUNTER — Telehealth: Payer: Self-pay

## 2017-12-24 ENCOUNTER — Telehealth: Payer: Self-pay | Admitting: *Deleted

## 2017-12-24 ENCOUNTER — Telehealth: Payer: Self-pay | Admitting: Gastroenterology

## 2017-12-24 NOTE — Telephone Encounter (Signed)
Pt had colonoscopy with Dr. Chales AbrahamsGupta yesterday. She said that he recommended a colon rectal surgeon for hemorrhoids but pt does not remember the name of the doctor.

## 2017-12-24 NOTE — Telephone Encounter (Signed)
Pt returned your call stating that she is doing well.  °

## 2017-12-24 NOTE — Telephone Encounter (Signed)
Pt return call °

## 2017-12-24 NOTE — Telephone Encounter (Signed)
  Follow up Call-  Call back number 12/23/2017  Post procedure Call Back phone  # [phone]  Permission to leave phone message Yes  Some recent data might be hidden     Patient questions:  Message left to call us if necessary. Second call.

## 2017-12-24 NOTE — Telephone Encounter (Signed)
NO ANSWER, MESSAGE LEFT FOR PATIENT. 

## 2017-12-25 NOTE — Telephone Encounter (Signed)
I left a message asking the patient to call back to the office.  Procedure note states "would consider surgical evaluation...".

## 2017-12-27 ENCOUNTER — Encounter: Payer: Self-pay | Admitting: Gastroenterology

## 2017-12-30 ENCOUNTER — Telehealth: Payer: Self-pay

## 2017-12-30 NOTE — Telephone Encounter (Signed)
-----   Message from Lynann Bolognaajesh Gupta, MD sent at 12/29/2017  5:48 PM EDT ----- Regarding: RE: ? referral Please clarify I have reviewed the colonoscopy report She does not need surgery at this time Only if she has problems Elby Showersaj ----- Message ----- From: Arvella MerlesWalsh, Karin Pinedo, RN Sent: 12/25/2017   9:54 AM To: Lynann Bolognaajesh Gupta, MD Subject: ? referral                                     She is asking for the name of the surgeon you want her referred to but your procedure note states "would consider".  Please advise.  Thank you.

## 2017-12-30 NOTE — Telephone Encounter (Signed)
Message from Dr Chales AbrahamsGupta states "surgery not indicated".  Patient is aware.

## 2017-12-30 NOTE — Telephone Encounter (Signed)
I spoke with the patient and gave her this information.  She was in agreement.

## 2018-01-26 ENCOUNTER — Telehealth: Payer: Self-pay

## 2018-01-26 MED ORDER — DICYCLOMINE HCL 10 MG PO CAPS: 10.0000 mg | ORAL_CAPSULE | Freq: Two times a day (BID) | ORAL | 3 refills | Status: DC

## 2018-01-26 NOTE — Telephone Encounter (Signed)
Sent refills to patients pharmacy.  

## 2018-02-03 ENCOUNTER — Encounter: Payer: Self-pay | Admitting: Gastroenterology

## 2018-02-17 ENCOUNTER — Telehealth: Payer: Self-pay | Admitting: Gastroenterology

## 2018-02-23 DIAGNOSIS — R197 Diarrhea, unspecified: Secondary | ICD-10-CM | POA: Insufficient documentation

## 2018-02-23 NOTE — Telephone Encounter (Signed)
I have not seen them in [place].

## 2018-02-24 ENCOUNTER — Encounter: Payer: Self-pay | Admitting: Gastroenterology

## 2018-02-24 DIAGNOSIS — R945 Abnormal results of liver function studies: Secondary | ICD-10-CM | POA: Insufficient documentation

## 2018-03-06 ENCOUNTER — Other Ambulatory Visit: Payer: Self-pay | Admitting: Gastroenterology

## 2018-03-09 ENCOUNTER — Other Ambulatory Visit: Payer: Self-pay

## 2018-03-09 ENCOUNTER — Telehealth: Payer: Self-pay | Admitting: Gastroenterology

## 2018-03-09 MED ORDER — DICYCLOMINE HCL 10 MG PO CAPS: 10.0000 mg | ORAL_CAPSULE | Freq: Two times a day (BID) | ORAL | 3 refills | Status: DC

## 2018-03-09 NOTE — Telephone Encounter (Signed)
Do not have the records. I did see many this morning.  Do not remember seeing her's Lets put her on Bentyl 10 mg p.o. twice daily-half an hour before lunch and at bedtime if she is not already on it. You can schedule her appointment with me

## 2018-03-09 NOTE — Telephone Encounter (Signed)
Pt states Dr. Mauricio Poegina York did some lab tests that show she has IBS and she was going to be referred back to Dr. Chales AbrahamsGupta. Pt calling wanting to know if Dr. Chales AbrahamsGupta has gotten these. Reports she is still having abdominal cramping and diarrhea. Please advise.

## 2018-03-09 NOTE — Telephone Encounter (Signed)
Pt aware, script sent to pharmacy and pt scheduled to see Dr. Chales AbrahamsGupta in October. She is aware of appt.

## 2018-03-10 ENCOUNTER — Telehealth: Payer: Self-pay | Admitting: Gastroenterology

## 2018-03-10 NOTE — Telephone Encounter (Signed)
Pt called to inform that her pharmacy has not received 2 prescriptions for IBS that we sent. She also needs rf for pantoprazole. [facility] on [address] In [place].

## 2018-03-11 ENCOUNTER — Telehealth: Payer: Self-pay | Admitting: Gastroenterology

## 2018-03-11 MED ORDER — DICYCLOMINE HCL 10 MG PO CAPS: 10.0000 mg | ORAL_CAPSULE | Freq: Two times a day (BID) | ORAL | 3 refills | Status: DC

## 2018-03-11 NOTE — Telephone Encounter (Signed)
Problems with IBS

## 2018-03-11 NOTE — Telephone Encounter (Signed)
Sent refills to patients pharmacy, patient aware.

## 2018-03-11 NOTE — Telephone Encounter (Signed)
I had sent the Protonix refill in by Rx Refill.

## 2018-03-11 NOTE — Telephone Encounter (Signed)
[name] [name] this pt is calling asking about her prescriptions. There is a note that states they have been refilled but I did not see the protonix refill. Was that one sent?

## 2018-03-31 ENCOUNTER — Encounter: Payer: Self-pay | Admitting: Gastroenterology

## 2018-04-06 ENCOUNTER — Ambulatory Visit: Payer: BLUE CROSS/BLUE SHIELD | Admitting: Gastroenterology

## 2018-04-06 ENCOUNTER — Encounter: Payer: Self-pay | Admitting: Gastroenterology

## 2018-04-06 VITALS — BP 142/88 | HR 71 | Ht 62.0 in | Wt 154.1 lb

## 2018-04-06 DIAGNOSIS — K580 Irritable bowel syndrome with diarrhea: Secondary | ICD-10-CM

## 2018-04-06 MED ORDER — DIPHENOXYLATE-ATROPINE 2.5-0.025 MG PO TABS: 1.0000 | ORAL_TABLET | Freq: Three times a day (TID) | ORAL | 3 refills | Status: DC | PRN

## 2018-04-06 MED ORDER — DICYCLOMINE HCL 10 MG PO CAPS: 20.0000 mg | ORAL_CAPSULE | Freq: Two times a day (BID) | ORAL | 3 refills | Status: DC

## 2018-04-06 MED ORDER — RIFAXIMIN 550 MG PO TABS: 550.0000 mg | ORAL_TABLET | Freq: Three times a day (TID) | ORAL | 0 refills | Status: DC

## 2018-04-06 NOTE — Patient Instructions (Signed)
If you are age 61 or older, your body mass index should be between 23-30. Your Body mass index is 28.19 kg/m. If this is out of the aforementioned range listed, please consider follow up with your Primary Care Provider.  If you are age 41 or younger, your body mass index should be between 19-25. Your Body mass index is 28.19 kg/m. If this is out of the aformentioned range listed, please consider follow up with your Primary Care Provider.   We have sent the following medications to your pharmacy for you to pick up at your convenience: Lomotil  Bentyl Rifaxamin  Stop all carbonated drinks.   Thank you,  Dr. Lynann Bologna

## 2018-04-06 NOTE — Progress Notes (Signed)
Chief Complaint: FU  Referring Provider:  Syble Creek FNP      ASSESSMENT AND PLAN;   #1. IBS with predominant diarrhea. Neg colonoscopy with random colonic biopsies 12/2017.  Fair prep.  #2. Screening/survillance -neg colon 12/2017 (fair prep).  History of colonic polyps at [facility] (Dr Noe Gens) 2013.  Plan: Increased Bentyl 10 mg p.o. 2 twice daily for history suggestive of colonic spasms.  Rifaxamin 550mg  po tid x 2 weeks (for postinfectious IBS) Lomotil 1 tab po tid. If still with problems, trial of viberzi 100mg  po bid (GB is still in) Follow stool studies. Stop all carbonated beverages including beer. FU in 12 weeks. Discussed with patient's family.    HPI:    [name] [name] is a 62 y.o. female  For Follow-up Somewhat better but still with diarrhea Diarrhea -3-4 loose watery stools without blood x 10 days, stool studies pending, prev treated empirically with Flagyl with good results while she was taking it.  Then the diarrhea restarted.  Certainly better than previously when she was having 7-8 bowel movements per day. Does admit that stress has significant role to play.  No melena or hematochezia. Lower abdominal pain with bloating.  The pain gets relieved on defecation. Has gained weight from 151 pounds at the last visit 10/2017 to 154 pounds today. Has good appetite.  Past GI procedures: -Patient underwent CT scan of the abdomen pelvis 02/22/2017 which showed mild thickening of gastric antrum. -EGD 04/22/2017 showed erosive gastritis with negative biopsies for H. Pylori. -Colonoscopy: Neg colonoscopy with random colonic biopsies 12/2017.  Fair prep.  TI could not be intubated. Prev-[facility] (Dr Noe Gens) 2013 - ? Small polyps, poor prep per patient.    Past Medical History:  Diagnosis Date  . Allergy   . Anxiety   . Arthritis   . Blood transfusion without reported diagnosis   . Depression   . Hyperlipidemia   . IBS (irritable bowel syndrome)   . No pertinent past  medical history     Past Surgical History:  Procedure Laterality Date  . COLONOSCOPY  12/23/2017  . DILATION AND CURETTAGE OF UTERUS    . ESOPHAGOGASTRODUODENOSCOPY  04/22/2017   Erosive gastrtis. Otherwise normal EGD.   Marland Kitchen MASS EXCISION  09/11/2011   Procedure: EXCISION MASS;  Surgeon: Wyn Forster., MD;  Location: [facility];  Service: Orthopedics;  Laterality: Right;  excisional biopsy right hand     Family History  Problem Relation Age of Onset  . Colon cancer Neg Hx   . Stomach cancer Neg Hx   . Esophageal cancer Neg Hx     Social History   Tobacco Use  . Smoking status: Never Smoker  . Smokeless tobacco: Never Used  Substance Use Topics  . Alcohol use: Yes    Alcohol/week: 14.0 standard drinks    Types: 14 Cans of beer per week    Comment: SOCIAL  . Drug use: No    Current Outpatient Medications  Medication Sig Dispense Refill  . acyclovir (ZOVIRAX) 400 MG tablet Take 800 mg by mouth at bedtime.    . ALPRAZolam (XANAX) 1 MG tablet Take 0.5 mg by mouth daily as needed for anxiety.     . Cholecalciferol (VITAMIN D3) 1000 units CAPS Take 1,000 Units by mouth daily at 10 pm.    . dicyclomine (BENTYL) 10 MG capsule Take 1 capsule (10 mg total) by mouth 2 (two) times daily. 60 capsule 3  . EPINEPHrine 0.3 mg/0.3 mL IJ  SOAJ injection     . escitalopram (LEXAPRO) 20 MG tablet Take 30 mg by mouth at bedtime.    . fexofenadine (ALLEGRA) 180 MG tablet Take 180 mg by mouth daily.    . pantoprazole (PROTONIX) 20 MG tablet TAKE 1 TABLET BY MOUTH EVERY DAY 90 tablet 3  . rosuvastatin (CRESTOR) 5 MG tablet Take 5 mg by mouth daily.    . diphenoxylate-atropine (LOMOTIL) 2.5-0.025 MG tablet Take 1 tablet by mouth 4 (four) times daily as needed for diarrhea or loose stools.     Current Facility-Administered Medications  Medication Dose Route Frequency Provider Last Rate Last Dose  . 0.9 %  sodium chloride infusion  500 mL Intravenous Continuous Lynann Bologna, MD         Allergies  Allergen Reactions  . Grass Extracts [Gramineae Pollens]     Review of Systems:  Negative except for HPI     Physical Exam:    BP (!) 142/88   Pulse 71   Ht 5\' 2"  (1.575 m)   Wt 154 lb 2 oz (69.9 kg)   BMI 28.19 kg/m  Filed Weights   04/06/18 0828  Weight: 154 lb 2 oz (69.9 kg)   Constitutional:  Well-developed, in no acute distress. Psychiatric: Normal mood and affect. Behavior is normal. HEENT: Pupils normal.  Conjunctivae are normal. No scleral icterus. Neck supple.  Cardiovascular: Normal rate, regular rhythm. No edema Pulmonary/chest: Effort normal and breath sounds normal. No wheezing, rales or rhonchi. Abdominal: Soft, nondistended. Nontender. Bowel sounds active throughout. There are no masses palpable. No hepatomegaly. Rectal:  defered Neurological: Alert and oriented to person place and time. Skin: Skin is warm and dry. No rashes noted. I spent 15 minutes of face-to-face time with the patient. Greater than 50% of the time was spent counseling and coordinating care.   Edman Circle, MD 04/06/2018, 8:49 AM  Cc: Syble Creek FNP

## 2018-04-13 DIAGNOSIS — J019 Acute sinusitis, unspecified: Secondary | ICD-10-CM | POA: Insufficient documentation

## 2018-04-20 ENCOUNTER — Telehealth: Payer: Self-pay | Admitting: Gastroenterology

## 2018-04-21 NOTE — Telephone Encounter (Signed)
Would you like to give refill of Lomotil?

## 2018-04-21 NOTE — Telephone Encounter (Signed)
Please call in Lomotil 1 tablet p.o. 3 times daily as needed #120 2 refills

## 2018-04-22 MED ORDER — DIPHENOXYLATE-ATROPINE 2.5-0.025 MG PO TABS: 1.0000 | ORAL_TABLET | Freq: Three times a day (TID) | ORAL | 2 refills | Status: DC | PRN

## 2018-04-22 NOTE — Telephone Encounter (Signed)
I have printed the prescription, she is going to come by the office to pick it up.

## 2018-06-15 ENCOUNTER — Telehealth: Payer: Self-pay | Admitting: Gastroenterology

## 2018-06-15 NOTE — Telephone Encounter (Signed)
Left message for patient to call back  

## 2018-06-15 NOTE — Telephone Encounter (Signed)
Dr. Chales AbrahamsGUPTA pt -Called and spoke with patient-patient reports she has had heartburn for the last 3 days-also reports she has been on antibiotics for the last 3 days for a 5 day course of antibx for sinus infection/URI-patient reports she has been taking her Protonix as order- Patient is requesting alternative medication to help with the heartburn "being so bad"; patient has taken Pepto Bismol and Tums; patient also reports she did not take any of her medications this morning because "I did not want to have the heartburn so bad" Please advise

## 2018-06-15 NOTE — Telephone Encounter (Signed)
I would recommend Gaviscon liquid take 1-2 tablespoons prn up to 5 doses in a day  Would also be important to know what the antibiotic is as sometimes certain ones - the one I am thinking of is doxycycline - can cause bad irritation of the esophagus.  I also suggest [name] [name] check with whomever prescribed the antibiotic because it may be a side effect that will not respond to the Gaviscon or another medication and [name] [name] may need to have the antibiotic changed

## 2018-06-18 NOTE — Telephone Encounter (Signed)
Called and spoke with patient-informed patient of MD recommendations concerning OTC Gaviscon and contacting the provider who ordered the antibiotic as it may need to be changed if symptoms do not subside with Gaviscon; Patient verbalized understanding of information/instructions; Patient was advised to call back if questions/concerns arise;

## 2018-06-28 ENCOUNTER — Other Ambulatory Visit: Payer: Self-pay | Admitting: Gastroenterology

## 2018-07-09 DIAGNOSIS — K1370 Unspecified lesions of oral mucosa: Secondary | ICD-10-CM | POA: Insufficient documentation

## 2018-08-22 ENCOUNTER — Other Ambulatory Visit: Payer: Self-pay | Admitting: Gastroenterology

## 2018-10-20 ENCOUNTER — Telehealth: Payer: Self-pay | Admitting: Gastroenterology

## 2018-10-20 NOTE — Telephone Encounter (Signed)
Spoke with patient and patient reports daily episodes of diarrhea (2-3 times daily), the patient has had 3 loose to liquid BMs in the past 24 hours. She reports a scant amount of blood on the tissue but none visible in the commode (she attributed the blood to her hemorrhoids). The patient reports daily headaches but denies nausea and vomiting, dizziness, weakness, decreased urine output or decreased appetite. The patient reports she is prescribed Lomotil but admits she has only taken it BID instead of TID for the past several weeks and ran out 2 days ago and has not refilled the prescription. This RN told the patient to call the pharmacy to have her prescription refilled and actually administered the prescription per Dr. Urban Gibson recommendation (TID). The patient also admits to continuing to drink daily Pepsi's (one every morning) and daily beers (2-3 every evening) against previous recommendations per Dr. Urban Gibson OV notes. This RN reinforced MD's recommendations of no carbonated drinks. Please advise.

## 2018-10-21 ENCOUNTER — Other Ambulatory Visit: Payer: Self-pay

## 2018-10-21 ENCOUNTER — Other Ambulatory Visit: Payer: Self-pay | Admitting: *Deleted

## 2018-10-21 ENCOUNTER — Other Ambulatory Visit: Payer: Self-pay | Admitting: Gastroenterology

## 2018-10-21 DIAGNOSIS — K580 Irritable bowel syndrome with diarrhea: Secondary | ICD-10-CM

## 2018-10-21 MED ORDER — DICYCLOMINE HCL 10 MG PO CAPS: 20.0000 mg | ORAL_CAPSULE | Freq: Two times a day (BID) | ORAL | 1 refills | Status: DC

## 2018-10-21 NOTE — Telephone Encounter (Signed)
Plan: -Lets get stool tests for GI pathogens, WBCs. -Check CBC, CMP, C-reactive protein, sed rate. -Lomotil 1 tab po tid. -Must take Bentyl as per previous note -Stop all carbonated beverages -Call us in 1 week if still with problems.  If still with problems, will start Viberzi 100 twice daily

## 2018-10-21 NOTE — Telephone Encounter (Signed)
Spoke with patient to reinforce Dr. Urban Gibson recommendations (Bentyl two 20 mg cap BID, Lomotil one 10 mg tab TID, no carbonated beverages). Lab orders in Epic, appointment scheduled Tues 10/26/18 at 10:30 am. Patient told to call back in one week if problems continue.

## 2018-10-21 NOTE — Telephone Encounter (Signed)
Patient states she was in the car and not at home and would like to go over what she is suppose to do again. Patient has questions about pantoprazole as well.

## 2018-10-22 ENCOUNTER — Other Ambulatory Visit: Payer: Self-pay | Admitting: *Deleted

## 2018-10-22 MED ORDER — DIPHENOXYLATE-ATROPINE 2.5-0.025 MG PO TABS: ORAL_TABLET | ORAL | 1 refills | Status: DC

## 2018-10-22 NOTE — Telephone Encounter (Signed)
Lotomil prescription sent to the pharmacy. Left message for patient.

## 2018-10-25 ENCOUNTER — Other Ambulatory Visit: Payer: Self-pay

## 2018-10-25 ENCOUNTER — Other Ambulatory Visit: Payer: BLUE CROSS/BLUE SHIELD

## 2018-10-25 MED ORDER — DIPHENOXYLATE-ATROPINE 2.5-0.025 MG PO TABS: ORAL_TABLET | ORAL | 1 refills | Status: DC

## 2018-10-26 ENCOUNTER — Other Ambulatory Visit (INDEPENDENT_AMBULATORY_CARE_PROVIDER_SITE_OTHER): Payer: BLUE CROSS/BLUE SHIELD

## 2018-10-26 ENCOUNTER — Other Ambulatory Visit: Payer: Self-pay

## 2018-10-26 DIAGNOSIS — K580 Irritable bowel syndrome with diarrhea: Secondary | ICD-10-CM

## 2018-10-26 LAB — CBC WITH DIFFERENTIAL/PLATELET
Basophils Absolute: 0.1 10*3/uL (ref 0.0–0.1)
Basophils Relative: 1.1 % (ref 0.0–3.0)
Eosinophils Absolute: 0.2 10*3/uL (ref 0.0–0.7)
Eosinophils Relative: 3.4 % (ref 0.0–5.0)
HCT: 38.3 % (ref 36.0–46.0)
Hemoglobin: 13 g/dL (ref 12.0–15.0)
Lymphocytes Relative: 31 % (ref 12.0–46.0)
Lymphs Abs: 1.7 10*3/uL (ref 0.7–4.0)
MCHC: 34 g/dL (ref 30.0–36.0)
MCV: 95.5 fl (ref 78.0–100.0)
Monocytes Absolute: 0.5 10*3/uL (ref 0.1–1.0)
Monocytes Relative: 9.8 % (ref 3.0–12.0)
Neutro Abs: 3 10*3/uL (ref 1.4–7.7)
Neutrophils Relative %: 54.7 % (ref 43.0–77.0)
Platelets: 230 10*3/uL (ref 150.0–400.0)
RBC: 4.01 Mil/uL (ref 3.87–5.11)
RDW: 13.6 % (ref 11.5–15.5)
WBC: 5.4 10*3/uL (ref 4.0–10.5)

## 2018-10-26 LAB — COMPREHENSIVE METABOLIC PANEL
ALT: 27 U/L (ref 0–35)
AST: 31 U/L (ref 0–37)
Albumin: 4 g/dL (ref 3.5–5.2)
Alkaline Phosphatase: 89 U/L (ref 39–117)
BUN: 9 mg/dL (ref 6–23)
CO2: 30 mEq/L (ref 19–32)
Calcium: 9.4 mg/dL (ref 8.4–10.5)
Chloride: 102 mEq/L (ref 96–112)
Creatinine, Ser: 0.72 mg/dL (ref 0.40–1.20)
GFR: 82.23 mL/min (ref >60.00)
Glucose, Bld: 111 mg/dL — ABNORMAL HIGH (ref 70–99)
Potassium: 4 mEq/L (ref 3.5–5.1)
Sodium: 140 mEq/L (ref 135–145)
Total Bilirubin: 0.5 mg/dL (ref 0.2–1.2)
Total Protein: 6.9 g/dL (ref 6.0–8.3)

## 2018-10-26 LAB — SEDIMENTATION RATE: Sed Rate: 20 mm/hr (ref 0–30)

## 2018-10-26 LAB — C-REACTIVE PROTEIN: CRP: <1 mg/dL (ref 0.5–20.0)

## 2018-10-27 NOTE — Addendum Note (Signed)
Addended by: Harley Alto on: 10/27/2018 11:37 AM   Modules accepted: Orders

## 2018-10-28 LAB — GASTROINTESTINAL PATHOGEN PANEL PCR
C. difficile Tox A/B, PCR: NOT DETECTED
Campylobacter, PCR: NOT DETECTED
Cryptosporidium, PCR: NOT DETECTED
E coli (ETEC) LT/ST PCR: NOT DETECTED
E coli (STEC) stx1/stx2, PCR: NOT DETECTED
E coli 0157, PCR: NOT DETECTED
Giardia lamblia, PCR: NOT DETECTED
Norovirus, PCR: NOT DETECTED
Rotavirus A, PCR: NOT DETECTED
Salmonella, PCR: NOT DETECTED
Shigella, PCR: NOT DETECTED

## 2018-10-29 LAB — FECAL LACTOFERRIN, QUANT
Fecal Lactoferrin: NEGATIVE
MICRO NUMBER:: 470788
SPECIMEN QUALITY:: ADEQUATE

## 2018-11-29 DIAGNOSIS — R21 Rash and other nonspecific skin eruption: Secondary | ICD-10-CM | POA: Insufficient documentation

## 2018-11-29 DIAGNOSIS — L309 Dermatitis, unspecified: Secondary | ICD-10-CM | POA: Insufficient documentation

## 2018-12-23 ENCOUNTER — Other Ambulatory Visit: Payer: Self-pay | Admitting: Gastroenterology

## 2019-01-14 ENCOUNTER — Telehealth: Payer: Self-pay | Admitting: Gastroenterology

## 2019-01-14 NOTE — Telephone Encounter (Signed)
Called and spoke with patient-patient reports she has been having diarrhea ever since her procedure on 12/24/2018; patient reports she has been taking her Lomotil as prescribed however it is not working; patient advised to continue increased fluids (water/Gatorade/Pedialyte) and to take Imodium, as directed on box, instead of Lomotil to see if symptoms decrease; patient advised to call back to the office on Monday to give an update on symptoms; Patient verbalized understanding of information/instructions; Patient advised to call back to office if questions/conerns arise or if symptoms worsen;   If patient does not call office by Monday 01/17/2019 pm -RN will call and check on patient

## 2019-01-17 NOTE — Telephone Encounter (Signed)
Is there anything the RN needs to tell the patient or any specific questions the MD would like to know in order to advise on next step in plan of care? Since the RN is going to call and check on this patient?

## 2019-01-18 NOTE — Telephone Encounter (Signed)
Called and spoke with patient-patient reports she has not done as recommended by the MD for symptom relief as she has been "working around the house so much I just forgot to get it"; patient is still having diarrhea at times, however, she has been increasing her po fluids to combat diarrhea symptoms; patient reports she will call the office "in about a week" and give and update on symptoms; patient advised to call back to the office should questions/concerns arise; patient verbalized understanding of information/instructions;

## 2019-01-18 NOTE — Telephone Encounter (Signed)
Pl call her And see how she is doing Thx RG

## 2019-02-23 ENCOUNTER — Other Ambulatory Visit: Payer: Self-pay | Admitting: Gastroenterology

## 2019-02-25 ENCOUNTER — Other Ambulatory Visit: Payer: Self-pay | Admitting: Gastroenterology

## 2019-03-29 DIAGNOSIS — R4189 Other symptoms and signs involving cognitive functions and awareness: Secondary | ICD-10-CM | POA: Insufficient documentation

## 2019-04-13 ENCOUNTER — Encounter: Payer: Self-pay | Admitting: Neurology

## 2019-06-03 ENCOUNTER — Ambulatory Visit: Payer: BLUE CROSS/BLUE SHIELD | Admitting: Neurology

## 2019-06-23 ENCOUNTER — Telehealth (INDEPENDENT_AMBULATORY_CARE_PROVIDER_SITE_OTHER): Payer: BLUE CROSS/BLUE SHIELD | Admitting: Neurology

## 2019-06-23 ENCOUNTER — Telehealth: Payer: Self-pay

## 2019-06-23 ENCOUNTER — Encounter: Payer: Self-pay | Admitting: Neurology

## 2019-06-23 ENCOUNTER — Other Ambulatory Visit: Payer: Self-pay

## 2019-06-23 VITALS — Ht 62.0 in | Wt 147.0 lb

## 2019-06-23 DIAGNOSIS — R413 Other amnesia: Secondary | ICD-10-CM | POA: Diagnosis not present

## 2019-06-23 DIAGNOSIS — R400 Somnolence: Secondary | ICD-10-CM

## 2019-06-23 NOTE — Telephone Encounter (Signed)
Left message for [facility] to return the call and leave fax number so order for Nocturnal Polysomnoagaphy can be faxed to them.

## 2019-06-23 NOTE — Progress Notes (Signed)
Virtual Visit via Video Note The purpose of this virtual visit is to provide medical care while limiting exposure to the novel coronavirus.    Consent was obtained for video visit:  Yes.   Answered questions that patient had about telehealth interaction:  Yes.   I discussed the limitations, risks, security and privacy concerns of performing an evaluation and management service by telemedicine. I also discussed with the patient that there may be a patient responsible charge related to this service. The patient expressed understanding and agreed to proceed.  Pt location: Home Physician Location: office Name of referring provider:  Dema Severin, NP I connected with [name] [name] at patients initiation/request on 06/23/2019 at 10:30 AM EST by video enabled telemedicine application and verified that I am speaking with the correct person using two identifiers. Pt MRN:  [id number] Pt DOB:  [birth year] Video Participants:  [name] [name]   History of Present Illness:  This is a 64 year old right-handed woman with a history of hyperlipidemia, IBS, anxiety, presenting for evaluation of memory loss. She started noticing changes a couple of years ago, she would forget where things are. She denies getting lost driving but would miss turns and at times would be "a little out of my lane." Her boyfriend tells her she just does not listen. She is usually good with taking medications regularly, maybe missing once a week. She has lived with her boyfriend for 15 years, he manages finances. She has not left the stove on. She has occasional word-finding difficulties. Her mood is terrible, "jumping every which way" due to noise in the house. Sometimes she is overwhelmed with her grandchildren, endorsing a lot of anxiety. She takes Xanax BID with Lexapro. She usually gets 4-5 hours of sleep, waking up due to dreams or leg cramps, needing to get out of bed. She is drowsy during the day. 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 She is sensitive to lights. She usually sits and down and it resolves. She is able to function and takes Tylenol.She reports "nerves jumping/veins" in her eyes and has eye drops. She turns her head to look at something and her eyes would be blurred. Sometimes she has to shake her head or grab the back of her head when driving. She gets dizzy in the car. She has arthritis in her back. She has had frequent bowel movements with diarrhea the past 2 years. She has tremors when upset. No anosmia. She drinks a glass of wine every night, skipping a day or two with 2 beers. No family history of dementia. No history of significant head injuries.   She had an MRI brain without contrast in 03/2019 which did not show any acute changes. There was mild to moderate changes in the white matter most likely chronic microvascular ischemia.   PAST MEDICAL HISTORY: Past Medical History:  Diagnosis Date  . Allergy   . Anxiety   . Arthritis   . Blood transfusion without reported diagnosis   . Depression   . Hyperlipidemia   . IBS (irritable bowel syndrome)   . No pertinent past medical history     PAST SURGICAL HISTORY: Past Surgical History:  Procedure Laterality Date  . COLONOSCOPY  12/23/2017  . DILATION AND CURETTAGE OF UTERUS    . ESOPHAGOGASTRODUODENOSCOPY  04/22/2017   Erosive gastrtis. Otherwise normal EGD.   Marland Kitchen MASS EXCISION  09/11/2011   Procedure: EXCISION MASS;  Surgeon: Wyn Forster., MD;  Location: [facility];  Service: Orthopedics;  Laterality: Right;  excisional biopsy right hand     MEDICATIONS: Current Outpatient Medications on File Prior to Visit  Medication Sig Dispense Refill  . acyclovir (ZOVIRAX) 400 MG tablet Take 800 mg by mouth at bedtime.    . ALPRAZolam (XANAX) 1 MG tablet Take 0.5 mg by mouth 2 (two) times daily.     . Cholecalciferol (VITAMIN D3) 1000 units CAPS Take 1,000 Units by mouth daily at 10 pm.    . dicyclomine (BENTYL) 10 MG  capsule Take 2 capsules (20 mg total) by mouth 2 (two) times daily. 360 capsule 1  . diphenoxylate-atropine (LOMOTIL) 2.5-0.025 MG tablet TAKE 1 TABLET BY MOUTH 3 TIMES A DAY AS NEEDED FOR DIARRHEA OR LOOSE STOOL 90 tablet 1  . EPINEPHrine 0.3 mg/0.3 mL IJ SOAJ injection     . escitalopram (LEXAPRO) 20 MG tablet Take 30 mg by mouth at bedtime. Takes 1.5 qhs    . fexofenadine (ALLEGRA) 180 MG tablet Take 180 mg by mouth daily.    . pantoprazole (PROTONIX) 20 MG tablet TAKE 1 TABLET BY MOUTH EVERY DAY 90 tablet 3  . rosuvastatin (CRESTOR) 5 MG tablet Take 5 mg by mouth daily.     No current facility-administered medications on file prior to visit.    ALLERGIES: Allergies  Allergen Reactions  . Grass Extracts [Gramineae Pollens]     FAMILY HISTORY: Family History  Problem Relation Age of Onset  . Colon cancer Neg Hx   . Stomach cancer Neg Hx   . Esophageal cancer Neg Hx      Current Outpatient Medications on File Prior to Visit  Medication Sig Dispense Refill  . acyclovir (ZOVIRAX) 400 MG tablet Take 800 mg by mouth at bedtime.    . ALPRAZolam (XANAX) 1 MG tablet Take 0.5 mg by mouth 2 (two) times daily.     . Cholecalciferol (VITAMIN D3) 1000 units CAPS Take 1,000 Units by mouth daily at 10 pm.    . dicyclomine (BENTYL) 10 MG capsule Take 2 capsules (20 mg total) by mouth 2 (two) times daily. 360 capsule 1  . diphenoxylate-atropine (LOMOTIL) 2.5-0.025 MG tablet TAKE 1 TABLET BY MOUTH 3 TIMES A DAY AS NEEDED FOR DIARRHEA OR LOOSE STOOL 90 tablet 1  . EPINEPHrine 0.3 mg/0.3 mL IJ SOAJ injection     . escitalopram (LEXAPRO) 20 MG tablet Take 30 mg by mouth at bedtime. Takes 1.5 qhs    . fexofenadine (ALLEGRA) 180 MG tablet Take 180 mg by mouth daily.    . pantoprazole (PROTONIX) 20 MG tablet TAKE 1 TABLET BY MOUTH EVERY DAY 90 tablet 3  . rosuvastatin (CRESTOR) 5 MG tablet Take 5 mg by mouth daily.     No current facility-administered medications on file prior to visit.      Observations/Objective:   Vitals:   06/23/19 0945  Weight: 147 lb (66.7 kg)  Height: 5\' 2"  (1.575 m)   GEN:  The patient appears stated age and is in NAD. She is noted to be anxious, restless, fidgety.  Neurological examination: Patient is awake, alert, oriented x 3. No aphasia or dysarthria. Intact fluency and comprehension. Remote and recent memory intact. SLUMS score 21/30  [employer] Mental Exam 06/23/2019  Weekday Correct 1  Current year 1  What state are we in? 1  Amount spent 1  Amount left 2  # of Animals 2  5 objects recall 5  Number series 2  Hour markers 2  Time correct 2  Placed X in triangle correctly 1  Largest Figure 1  Name of female 0  Date back to work 0  Type of work 0  State she lived in 0  Total score 21   Cranial nerves: Extraocular movements intact with no nystagmus. No facial asymmetry. Motor: moves all extremities symmetrically, at least anti-gravity x 4. No incoordination on finger to nose testing. Gait: narrow-based and steady, able to tandem walk adequately. Negative Romberg test.  Assessment and Plan:   This is a 64 year old right-handed woman with a history of hyperlipidemia, IBS, anxiety, presenting for evaluation of memory loss. Her neurological exam is non-focal, SLUMS score today 21/30. MRI brain shows mild to moderate chronic microvascular disease. We discussed different causes of memory loss. Check B12 level. We discussed how poor sleep can affect cognition, sleep study will be ordered. We also discussed anxiety/stress affecting cognition, which is likely the cause of her cognitive complaints. Neuropsychological testing will be ordered to further evaluate symptoms. No indication for cholinesterase inhibitors at this time. We discussed the importance of control of vascular risk factors, physical exercise, and brain stimulation exercises for brain health. Follow-up in 6 months, she knows to call for any changes.    Follow Up  Instructions:    -I discussed the assessment and treatment plan with the patient. The patient was provided an opportunity to ask questions and all were answered. The patient agreed with the plan and demonstrated an understanding of the instructions.   The patient was advised to call back or seek an in-person evaluation if the symptoms worsen or if the condition fails to improve as anticipated.    Cameron Sprang, MD

## 2019-07-08 DIAGNOSIS — D539 Nutritional anemia, unspecified: Secondary | ICD-10-CM | POA: Insufficient documentation

## 2019-07-11 ENCOUNTER — Telehealth: Payer: Self-pay

## 2019-07-11 ENCOUNTER — Telehealth: Payer: Self-pay | Admitting: Neurology

## 2019-07-11 NOTE — Telephone Encounter (Signed)
Pt called and advised She will need to start a daily vitamin B12 supplement daily, we would like the level to be greater than 400. Pt verbalized understanding

## 2019-07-11 NOTE — Telephone Encounter (Signed)
PCP notes: B12 level done 02/2019 was 173. Confirm with patient if this is being supplemented since then.

## 2019-07-12 ENCOUNTER — Telehealth: Payer: Self-pay | Admitting: Neurology

## 2019-07-12 NOTE — Telephone Encounter (Signed)
Pt called to confirm she is getting IM B12 and to let her know that with the injection she does not need to take the PO b12, pt verbalized understanding

## 2019-07-12 NOTE — Telephone Encounter (Signed)
Patient called regarding her B 12. She said that she forgot to mention that she takes the B 12 shot and that she will not need to take the B 12 Vitamin. Thank you

## 2019-07-28 ENCOUNTER — Encounter: Payer: Self-pay | Admitting: Psychology

## 2019-07-28 ENCOUNTER — Other Ambulatory Visit: Payer: Self-pay

## 2019-07-28 ENCOUNTER — Ambulatory Visit (INDEPENDENT_AMBULATORY_CARE_PROVIDER_SITE_OTHER): Payer: BLUE CROSS/BLUE SHIELD | Admitting: Psychology

## 2019-07-28 ENCOUNTER — Ambulatory Visit: Payer: BLUE CROSS/BLUE SHIELD | Admitting: Psychology

## 2019-07-28 DIAGNOSIS — F329 Major depressive disorder, single episode, unspecified: Secondary | ICD-10-CM | POA: Insufficient documentation

## 2019-07-28 DIAGNOSIS — F411 Generalized anxiety disorder: Secondary | ICD-10-CM

## 2019-07-28 DIAGNOSIS — F330 Major depressive disorder, recurrent, mild: Secondary | ICD-10-CM

## 2019-07-28 DIAGNOSIS — R413 Other amnesia: Secondary | ICD-10-CM

## 2019-07-28 DIAGNOSIS — F332 Major depressive disorder, recurrent severe without psychotic features: Secondary | ICD-10-CM | POA: Diagnosis not present

## 2019-07-28 NOTE — Progress Notes (Signed)
   Psychometrician Note   Cognitive testing was administered to Lilyan Punt by technician Wallace Keller, B.S. under the supervision of Dr. Newman Nickels, Ph.D., licensed psychologist. Ms. Hamidi did not appear overtly distressed by the testing session, per behavioral observation or via self-report to the technician. Rest breaks were offered.    In considering the patient's current level of functioning, level of presumed impairment, nature of symptoms, emotional and behavioral responses during the interview, level of literacy, and observed level of motivation/effort, a battery of tests was selected and communicated to the psychometrician.   Communication between the psychologist and technician was ongoing throughout the testing session and changes were made as deemed necessary based on patient performance on testing, technician observations and additional pertinent factors such as those listed above.   Nikiyah Fackler will return within approximately two weeks for an interactive feedback session with Dr. Milbert Coulter at which time her test performances, clinical impressions, and treatment recommendations will be reviewed in detail. The patient understands she can contact our office should she require our assistance before this time.  120 minutes were spent face-to-face with Ms. Eischen administering standardized tests. An additional 30 minutes were spent scoring by the technician. [CPT [id number], [id number]]  This note reflects time spent with the psychometrician and does not include test scores or any clinical interpretations made by Dr. Milbert Coulter. The full report will follow in a separate note.

## 2019-07-28 NOTE — Progress Notes (Signed)
NEUROPSYCHOLOGICAL EVALUATION [place]. [facility] Department of Neurology  Reason for Referral:   Clarann Lichtenberger is a 64 y.o. Caucasian female referred by Patrcia Dolly, M.D., to characterize her current cognitive functioning and assist with diagnostic clarity and treatment planning in the context of subjective cognitive decline and likely psychiatric comorbidities.  Assessment and Plan:   Clinical Impression(s): Ms. Duve' pattern of performance is suggestive of primary impairments surrounding aspects of executive functioning (namely cognitive flexibility, working memory, and nonverbal abstract reasoning), phonemic fluency, and encoding (i.e., learning) of novel information. Response inhibition exhibited performance variability. Performance was appropriate relative to premorbid intellectual estimations across domains of processing speed, basic attention, receptive language, semantic fluency, confrontation naming, visuospatial functioning, and both retention rates and consolidation scores across memory measures. Ms. Kaeo largely denied significant difficulties completing instrumental activities of daily living (ADLs) independently.  Across mood-related questionnaires, Ms. Zachman reported moderate symptoms of anxiety and severe symptoms of depression within the past 1-2 weeks. Ongoing psychiatric distress can certainly create and maintain cognitive inefficiencies, especially in the domains which she experienced her greatest weaknesses. It is possible that this ongoing distress represents the primary etiology of her deficits currently. Additionally, Ms. Doutt described moderate sleep dysfunction across a related questionnaire, as well as difficulties both falling and staying asleep during interview. Poor quality sleep can also impact cognitive functioning in the domains listed above and likely represents a secondary etiology for cognitive weaknesses. While I cannot rule out an  underlying neurocognitive disorder at the present time, I believe that it appears most likely that day-to-day difficulties are related to ongoing mood and sleep concerns presently.  Recommendations: A future repeat evaluation may be considered should Ms. Ayub optimize her mood and sleep difficulties and continues to experience subjective cognitive and/or functional decline. The current evaluation will serve as a good baseline to draw future comparisons against.   A combination of medication and psychotherapy has been shown to be most effective at treating symptoms of anxiety and depression. As such, Ms. Fishbein is encouraged to speak with her prescribing physician regarding medication adjustments to optimally manage these symptoms. Likewise, Ms. Vanderhart is encouraged to consider engaging in short-term psychotherapy to address symptoms of psychiatric distress. she would benefit from an active and collaborative therapeutic environment, rather than one purely supportive in nature. Recommended treatment modalities include Cognitive Behavioral Therapy (CBT) or Acceptance and Commitment Therapy (ACT).  Dr. Karel Jarvis previously mentioned a referral for a sleep study. I would also recommend the completion of a sleep study and encourage Ms. Carsey to follow through with this referral. Additionally, she identified that her boyfriend's snoring as one of several culprits for difficulties falling and staying asleep. She is encouraged to consider ways to address this. Possible examples could be sleeping with earplugs in or sleeping in another room altogether.  When learning new information, she would benefit from information being broken up into small, manageable pieces. She may also find it helpful to articulate the material in her own words and in a context to promote encoding at the onset of a new task. This material may need to be repeated multiple times to promote encoding.  Memory can be improved using internal  strategies such as rehearsal, repetition, chunking, mnemonics, association, and imagery. External strategies such as written notes in a consistently used memory journal, visual and nonverbal auditory cues such as a calendar on the refrigerator or appointments with alarm, such as on a cell phone, can also help maximize recall.  To address problems with working memory and executive functioning, she may wish to consider:   -Avoiding external distractions when needing to concentrate   -Limiting exposure to fast paced environments with multiple sensory demands   -Writing down complicated information and using checklists   -Attempting and completing one task at a time (i.e., no multi-tasking)   -Verbalizing aloud each step of a task to maintain focus   -Reducing the amount of information considered at one time  Review of Records:   Ms. Earlene PlaterDavis was seen by [facility](Patrcia DollyKaren Aquino, M.D.) on 06/23/2019 for an evaluation of memory loss. She reported first noticing changes a couple of years ago where she would forget where things are. She denied getting lost driving but would miss turns and at times would be "a little out of my lane." Her boyfriend has told her that she just does not listen, suggesting some attentional deficits. Ms. Earlene PlaterDavis also reported word finding difficulties. She has lived with her boyfriend for the past 15 years and he manages their finances. Ms Earlene PlaterDavis reported usually being able to take medications appropriately, maybe forgetting to take a dose once a week. Her mood was said to be terrible, "jumping every which way" due to noise in the house. Sometimes she seems overwhelmed with her grandchildren, endorsing a lot of anxiety. She takes Xanax BID with Lexapro. She usually gets 4-5 hours of sleep, waking up due to dreams or leg cramps and needing to get out of bed. She is drowsy during the day and a sleep study was ordered. Ms. Earlene PlaterDavis also reported "nerves jumping" in her eyes and has eye  drops. She will also turn her head to look at something and her eyes would be blurred. Performance on a brief cognitive screening instrument (SLUMS) was 21/30. Ultimately, Ms. Earlene PlaterDavis was referred for a comprehensive neuropsychological evaluation to characterize her cognitive abilities and to assist with diagnostic clarity and treatment planning.  Per Dr. Karel JarvisAquino, a recent brain MRI in 03/2019 revealed mild to moderate chronic small vessel ischemia.    Past Medical History:  Diagnosis Date  . Allergy   . Arthritis   . Blood transfusion without reported diagnosis   . Generalized anxiety disorder   . Hyperlipidemia   . IBS (irritable bowel syndrome)   . Major depressive disorder     Past Surgical History:  Procedure Laterality Date  . COLONOSCOPY  12/23/2017  . DILATION AND CURETTAGE OF UTERUS    . ESOPHAGOGASTRODUODENOSCOPY  04/22/2017   Erosive gastrtis. Otherwise normal EGD.   Marland Kitchen. MASS EXCISION  09/11/2011   Procedure: EXCISION MASS;  Surgeon: Wyn Forsterobert V Sypher Jr., MD;  Location: [facility];  Service: Orthopedics;  Laterality: Right;  excisional biopsy right hand     Current Outpatient Medications:  .  acyclovir (ZOVIRAX) 400 MG tablet, Take 800 mg by mouth at bedtime., Disp: , Rfl:  .  ALPRAZolam (XANAX) 1 MG tablet, Take 0.5 mg by mouth 2 (two) times daily. , Disp: , Rfl:  .  Cholecalciferol (VITAMIN D3) 1000 units CAPS, Take 1,000 Units by mouth daily at 10 pm., Disp: , Rfl:  .  dicyclomine (BENTYL) 10 MG capsule, Take 2 capsules (20 mg total) by mouth 2 (two) times daily., Disp: 360 capsule, Rfl: 1 .  diphenoxylate-atropine (LOMOTIL) 2.5-0.025 MG tablet, TAKE 1 TABLET BY MOUTH 3 TIMES A DAY AS NEEDED FOR DIARRHEA OR LOOSE STOOL, Disp: 90 tablet, Rfl: 1 .  EPINEPHrine 0.3 mg/0.3 mL IJ SOAJ injection, , Disp: ,  Rfl:  .  escitalopram (LEXAPRO) 20 MG tablet, Take 30 mg by mouth at bedtime. Takes 1.5 qhs, Disp: , Rfl:  .  fexofenadine (ALLEGRA) 180 MG tablet, Take 180 mg by  mouth daily., Disp: , Rfl:  .  pantoprazole (PROTONIX) 20 MG tablet, TAKE 1 TABLET BY MOUTH EVERY DAY, Disp: 90 tablet, Rfl: 3 .  rosuvastatin (CRESTOR) 5 MG tablet, Take 5 mg by mouth daily., Disp: , Rfl:   Clinical Interview:   Cognitive Symptoms: Decreased short-term memory: Endorsed. Ms. Gomm described misplacing things frequently around her home. Her daughter also mentioned trouble remembering things that are very "fresh" or that happened recently, including details of conversations. These were said to have been present for "more than the past year" and have exhibited a gradual decline over that time. Decreased long-term memory: Denied. Decreased attention/concentration: Endorsed. She reported trouble maintaining her focus and increased ease of distractibility. Specific examples were unable to be described, but Ms. Fischbach did note that her boyfriend consistently tells her that she "just doesn't listen." Reduced processing speed: Endorsed. Difficulties with executive functions: Endorsed. Mild difficulties with impulsivity were said to be longstanding in nature. However, she also noted more recent trouble with organization and complex planning. Her daughter noted slight personality changes in that Ms. Hargett seems more emotional or more easily tearful than previously.  Difficulties with emotion regulation: Denied. Difficulties with receptive language: Endorsed. However, specific examples were unable to be described. Difficulties with word finding: Endorsed. Decreased visuoperceptual ability: Endorsed. She reported that her "eyes jump," often causing blurred vision or trouble focusing on things in her environment. This was also said to cause some depth perception difficulties.   Difficulties completing ADLs: Largely denied. She noted occasionally catching herself placing pills in the wrong location in her pillbox. Her boyfriend manages their finances and has done so for an extended period of time.  She denied difficulties with driving outside of when her "eyes jump," which cause visual disturbances and her to drive closer to her lane line.   Additional Medical History: History of traumatic brain injury/concussion: Denied. History of stroke: Denied. History of seizure activity: Denied. History of known exposure to toxins: Denied. Symptoms of chronic pain: Endorsed. She described chronic pain symptoms in her back and shoulder, which can be functionally limiting at times. She reported previously using BC powder frequently, but denied current regular use of over-the-counter medications. Experience of frequent headaches/migraines: Endorsed. Mild frontal headaches were said to be a newer symptoms. Her daughter theorized that these were largely due to emerging stress/anxiety symptoms.  Frequent instances of dizziness/vertigo: Endorsed. Symptoms were said to emerge when raising her head after it has been in a lower position, as well as when going from a seated to standing position quickly. A history of falls was denied.   Sensory changes: As noted above, Ms. Prestia described visual disturbances at times surrounding her "eyes jump[ing]." She noted currently being able to "read well enough." Other sensory changes/difficulties (e.g., hearing, taste, or smell) were denied.   Balance/coordination difficulties: Endorsed. In addition to feeling dizzy or lightheaded at times, she did also report mild balance instability, noting that she often needs external support when trying to stand up from a previously lower position.  Other motor difficulties: Endorsed. Tremulous behaviors in her hands was said to be present during periods of high stress/anxiety.  Sleep History: Estimated hours obtained each night: Unclear. She reported generally being in bed for around 8 hours, but noted very broken sleep and likely  sleeps for a fraction of that time. Difficulties falling asleep: Endorsed. Difficulties were attributed to  loud snoring behaviors from her boyfriend, as well as anxious thoughts present on her mind.  Difficulties staying asleep: Endorsed. She reported occasionally being woken up by her boyfriend's snoring and him tossing and turning. She also reported tossing and turning herself due shoulder pain. She further noted being awoken due to leg cramps. A sleep study was ordered following her previous meeting with Dr. Delice Lesch, but no results were available at the present time.  Feels rested and refreshed upon awakening: Denied.  History of snoring: Endorsed. History of waking up gasping for air: Denied. Witnessed breath cessation while asleep: Denied.  History of vivid dreaming: Denied. Excessive movement while asleep: Endorsed. She reported tossing and turning due to shoulder pain causing difficulties getting comfortable. Instances of acting out her dreams: Denied.  Psychiatric/Behavioral Health History: Depression: Endorsed. Ms. Klindt was very tangential when discussing current depressive symptoms. She eventually acknowledged that she has been crying more frequently, especially since the ongoing COVID-19 pandemic has dramatically decreased her ability to work. She also noted negative emotions when her boyfriend tells her that she "just doesn't listen" and that she experiences increased stress upon returning home. Current or remote suicidal ideation, intent, or plan was denied.  Anxiety: Endorsed. Symptoms of generalized anxiety were said to be longstanding in nature and impact Ms. Bebeau regularly across several psychosocial domains. She denied previously working with a English as a second language teacher, noting that she takes Xanax and a "nerve pill" to address these symptoms.  Mania: Denied. Trauma History: Denied. Visual/auditory hallucinations: Denied. Delusional thoughts: Denied.  Tobacco: Denied. Alcohol: Ms. Pitter noted that she had been cutting back on her alcohol consumption over the past week or so. She was  largely unable to estimate how much she was drinking previously, describing a story where she drinks throughout the day and is unsure how many drinks she consumes. She also noted that this was not an every day occurrence. A history of problematic alcohol abuse or dependence was denied.  Recreational drugs: Denied. Caffeine: 1 Pepsi beverage per day.  Family History: Problem Relation Age of Onset  . Colon cancer Neg Hx   . Stomach cancer Neg Hx   . Esophageal cancer Neg Hx    This information was confirmed by Ms. Saxe.  Academic/Vocational History: Highest level of educational attainment: 12 years. Ms. Sublette graduated from high school, but noted that she struggled throughout academic settings. This was partially due to her trying to work while simultaneously attending school. She noted relative weaknesses in reading, mathematics, and "everything." History of developmental delay: Denied. History of grade repetition: Denied. Enrollment in special education courses: Denied. History of LD/ADHD: Denied.  Employment: Ms. Prigmore currently works cleaning houses. However, this work has dropped from full-time to maybe 2 days per week due to the ongoing COVID-19 pandemic.  Evaluation Results:   Behavioral Observations: Ms. Kruczek was accompanied by her daughter, arrived to her appointment on time, and was appropriately dressed and groomed. Observed gait and station were within normal limits. Gross motor functioning appeared intact upon informal observation and no abnormal movements (e.g., tremors) were noted. Her affect was notably anxious and throughout the interview and she was seen shifting her position in her chair and wringing her hands very often. Spontaneous speech was fluent and word finding difficulties were not observed during the clinical interview or testing procedures. During interview, she was very tangential when answering questions, sometimes providing a wholly  unrelated answer to the  question asked. This was attributed mostly to ongoing symptoms of anxiety. During testing, sustained attention was appropriate. Task engagement was adequate and she persisted when challenged. Overall, Ms. Digeronimo was cooperative with the clinical interview and subsequent testing procedures.   Adequacy of Effort: The validity of neuropsychological testing is limited by the extent to which the individual being tested may be assumed to have exerted adequate effort during testing. Ms. Hornung expressed her intention to perform to the best of her abilities and exhibited adequate task engagement and persistence. Scores across stand-alone and embedded performance validity measures were variable, but largely within expectation. However, results may be interpreted with a mild degree of caution.   Test Results: Ms. Yim was fully oriented at the time of the current evaluation.  Intellectual abilities based upon educational and vocational attainment were estimated to be in the below average range. Premorbid abilities were estimated to be within the well below average range based upon a single-word reading test.   Processing speed was below average to average. Basic attention was well below average. More complex attention (e.g., working memory) was exceptionally low. Executive functioning was variable. Cognitive flexibility was exceptionally low to well below average, response inhibition was exceptionally low to average, hypothesis testing/problem solving was average, nonverbal abstract reasoning was well below average, and pattern recognition and adaptability was below average to average.  Assessed receptive language abilities were average. Likewise, Ms. Rauth did not exhibit any difficulties comprehending task instructions and answered all questions asked of her appropriately. Assessed expressive language (e.g., verbal fluency and confrontation naming) was variable. Phonemic fluency was well below average, while  semantic fluency and confrontation naming were below average.     Assessed visuospatial/visuoconstructional abilities were below average to average.    Learning (i.e., encoding) of novel verbal and visual information was exceptionally low. Spontaneous delayed recall (i.e., retrieval) of previously learned information was exceptionally low to well below average. Despite this, retention rates were 100% across a story learning task, 50% across a list learning task, 83% across a shape learning task, and 74% across a complex figure drawing task. Performance across recognition tasks was largely appropriate given the presence of encoding deficits, suggesting some evidence for information consolidation.   Results of emotional screening instruments suggested that recent symptoms of generalized anxiety were in the moderate range, while symptoms of depression were within the severe range. A screening instrument assessing recent sleep quality suggested the presence of moderate sleep dysfunction.  Tables of Scores:   Note: This summary of test scores accompanies the interpretive report and should not be considered in isolation without reference to the appropriate sections in the text. Descriptors are based on appropriate normative data and may be adjusted based on clinical judgment. The terms "impaired" and "within normal limits (WNL)" are used when a more specific level of functioning cannot be determined.       Effort Testing:   DESCRIPTOR       ACS Word Choice: --- --- Below Expectation  Dot Counting Test: --- --- Within Expectation  HVLT-R Recognition Discrimination Index: --- --- Within Expectation  BVMT-R Retention Percentage: --- --- Within Expectation       Orientation:      Raw Score Percentile   NAB Orientation, Form 1 29/29 --- ---       Intellectual Functioning:           Standard Score Percentile   Test of Premorbid Functioning: 74 4 Well Below Average  Memory:          Wechsler  Memory Scale (WMS-IV):                       Raw Score (Scaled Score) Percentile     Logical Memory I 9/50 (2) <1 Exceptionally Low    Logical Memory II 9/50 (5) 5 Well Below Average    Logical Memory Recognition 19/30 3-9 Well Below Average       Hopkins Verbal Learning Test (HVLT-R), Form 1: Raw Score (T Score) Percentile     Total Trials 1-3 10/36 (8) <1 Exceptionally Low    Delayed Recall 2/12 (9) <1 Exceptionally Low    Recognition Discrimination Index 8 (28) 2 Exceptionally Low      True Positives 12 --- ---      False Positives 4 --- ---       Brief Visuospatial Memory Test (BVMT-R), Form 1: Raw Score (T Score) Percentile     Total Trials 1-3 11/36 (29) 2 Exceptionally Low    Delayed Recall 5/12 (34) 5 Well Below Average    Recognition Discrimination Index 4 6-10 Well Below Average      Recognition Hits 6/6 >16 Within Normal Limits      False Positive Errors 2 <1 Exceptionally Low        Attention/Executive Function:          Trail Making Test (TMT): Raw Score (T Score) Percentile     Part A 45 secs.,  1 error (38) 12 Below Average    Part B 178 secs.,  2 errors (31) 3 Well Below Average         Scaled Score Percentile   WAIS-IV Coding: 8 25 Average       NAB Attention Module, Form 1: T Score Percentile     Digits Forward 36 8 Well Below Average    Digits Backwards 26 1 Exceptionally Low       D-KEFS Color-Word Interference Test: Raw Score (Scaled Score) Percentile     Color Naming 32 secs. (10) 50 Average    Word Reading 25 secs. (10) 50 Average    Inhibition 105 secs. (4) 2 Well Below Average      Total Errors 10 errors (1) <1 Exceptionally Low    Inhibition/Switching 88 secs. (9) 37 Average      Total Errors 5 errors (8) 25 Average       D-KEFS Verbal Fluency Test: Raw Score (Scaled Score) Percentile     Letter Total Correct 16 (4) 2 Well Below Average    Category Total Correct 27 (7) 16 Below Average    Category Switching Total Correct 4 (1) <1  Exceptionally Low    Category Switching Accuracy 3 (2) <1 Exceptionally Low      Total Set Loss Errors 2 (10) 50 Average      Total Repetition Errors 2 (11) 63 Average       D-KEFS 20 Questions Test: Scaled Score Percentile     Total Weighted Achievement Score 10 50 Average    Initial Abstraction Score 8 25 Average       Wisconsin Card Sorting Test [facility]): Raw Score Percentile     Categories (trials) 1 (64) 6-10 Well Below Average    Total Errors 25 18 Below Average    Perseverative Errors 13 27 Average    Non-Perseverative Errors 12 12 Below Average    Failure to Maintain Set 1 --- ---  Language:          NAB Language Module, Form 1: T Score Percentile     Auditory Comprehension 43 25 Average    Naming 28/31 (39) 14 Below Average       Visuospatial/Visuoconstruction:      Raw Score Percentile   Clock Drawing: 8/10 --- Within Normal Limits       NAB Spatial Module, Form 1: T Score Percentile     Figure Drawing Copy 53 62 Average    Figure Drawing Immediate Recall 51 54 Average        Scaled Score Percentile   WAIS-IV Matrix Reasoning: 5 5 Well Below Average  WAIS-IV Visual Puzzles: 7 16 Below Average       Mood and Personality:      Raw Score Percentile   Beck Depression Inventory - II: 35 --- Severe  PROMIS Anxiety Questionnaire: 27 --- Moderate       Additional Questionnaires:      Raw Score Percentile   PROMIS Sleep Disturbance Questionnaire: 31 --- Moderate   Informed Consent and Coding/Compliance:   Ms. Earlene PlaterDavis was provided with a verbal description of the nature and purpose of the present neuropsychological evaluation. Also reviewed were the foreseeable risks and/or discomforts and benefits of the procedure, limits of confidentiality, and mandatory reporting requirements of this provider. The patient was given the opportunity to ask questions and receive answers about the evaluation. Oral consent to participate was provided by the patient.   This evaluation was  conducted by Newman NickelsZachary C. Tracina Beaumont, Ph.D., licensed clinical neuropsychologist. Ms. Earlene PlaterDavis completed a comprehensive clinical interview with Dr. Milbert CoulterMerz, billed as one unit [phone], and 150 minutes of cognitive testing and scoring, billed as one unit [phone] and four additional units [id number]. Psychometrist Wallace Kellerana Chamberlain, B.S., assisted Dr. Milbert CoulterMerz with test administration and scoring procedures. As a separate and discrete service, Dr. Milbert CoulterMerz spent a total of 180 minutes in interpretation and report writing billed as one unit [phone] and two units [id number].

## 2019-07-31 ENCOUNTER — Other Ambulatory Visit: Payer: Self-pay | Admitting: Gastroenterology

## 2019-08-05 ENCOUNTER — Encounter: Payer: Self-pay | Admitting: Psychology

## 2019-08-05 ENCOUNTER — Other Ambulatory Visit: Payer: Self-pay

## 2019-08-05 ENCOUNTER — Ambulatory Visit (INDEPENDENT_AMBULATORY_CARE_PROVIDER_SITE_OTHER): Payer: BLUE CROSS/BLUE SHIELD | Admitting: Psychology

## 2019-08-05 DIAGNOSIS — F411 Generalized anxiety disorder: Secondary | ICD-10-CM | POA: Diagnosis not present

## 2019-08-05 DIAGNOSIS — F332 Major depressive disorder, recurrent severe without psychotic features: Secondary | ICD-10-CM | POA: Diagnosis not present

## 2019-08-05 NOTE — Progress Notes (Signed)
   Neuropsychology Feedback Session [name] [name]. [facility] Department of Neurology  Reason for Referral:   [name] [name] a 64 y.o. Caucasian female referred by Patrcia Dolly, M.D.,to characterize hercurrent cognitive functioning and assist with diagnostic clarity and treatment planning in the context of subjective cognitive decline and likely psychiatric comorbidities.  Feedback:   [name] completed a comprehensive neuropsychological evaluation on 07/28/2019. Please refer to that encounter for the full report and recommendations. Briefly, results suggested primary impairments surrounding aspects of executive functioning (namely cognitive flexibility, working memory, and nonverbal abstract reasoning), phonemic fluency, and encoding (i.e., learning) of novel information. Response inhibition exhibited performance variability. Across mood-related questionnaires, [name] reported moderate symptoms of anxiety and severe symptoms of depression within the past 1-2 weeks. Ongoing psychiatric distress can certainly create and maintain cognitive inefficiencies, especially in the domains which she experienced her greatest weaknesses. It is possible that this ongoing distress represents the primary etiology of her deficits currently. Additionally, [name] described moderate sleep dysfunction across a related questionnaire, as well as difficulties both falling and staying asleep during interview. Poor quality sleep can also impact cognitive functioning in the domains listed above and likely represents a secondary etiology for cognitive weaknesses. While I cannot rule out an underlying neurocognitive disorder at the present time, I believe that it appears most likely that day-to-day difficulties are related to ongoing mood and sleep concerns presently.  [name] was accompanied by her boyfriend to the current telephone call. Content of the current session focused on the results of her  neuropsychological evaluation. [name] was given the opportunity to ask questions and her questions were answered. She was encouraged to reach out should additional questions arise. A copy of her report was mailed at the conclusion of the visit.      17 minutes were spent conducting the current feedback session with [name], billed as one unit [phone]

## 2019-09-15 ENCOUNTER — Encounter: Payer: Self-pay | Admitting: *Deleted

## 2019-09-15 NOTE — Progress Notes (Signed)
Abstract for appt with Dr. Epimenio Foot on 09/16/19. Source: [facility], referring provider Corinda Gubler MD.

## 2019-09-16 ENCOUNTER — Other Ambulatory Visit: Payer: Self-pay

## 2019-09-16 ENCOUNTER — Encounter: Payer: Self-pay | Admitting: Neurology

## 2019-09-16 ENCOUNTER — Ambulatory Visit: Payer: BLUE CROSS/BLUE SHIELD | Admitting: Neurology

## 2019-09-16 VITALS — BP 156/96 | HR 71 | Temp 97.9°F | Ht 62.0 in | Wt 163.5 lb

## 2019-09-16 DIAGNOSIS — H5319 Other subjective visual disturbances: Secondary | ICD-10-CM | POA: Insufficient documentation

## 2019-09-16 DIAGNOSIS — G514 Facial myokymia: Secondary | ICD-10-CM | POA: Diagnosis not present

## 2019-09-16 DIAGNOSIS — F419 Anxiety disorder, unspecified: Secondary | ICD-10-CM

## 2019-09-16 MED ORDER — OXCARBAZEPINE 150 MG PO TABS: ORAL_TABLET | ORAL | 5 refills | Status: AC

## 2019-09-16 NOTE — Progress Notes (Signed)
[facility]  PATIENT: [name] [name] DOB: 15-Nov-1955  REFERRING DOCTOR OR PCP: Gevena Cotton, MD (ophthalmology); Heide Scales (PCP) SOURCE: Patient, notes from Dr. Frederico Hamman, imaging reports, MRI images personally reviewed.  _________________________________   HISTORICAL  CHIEF COMPLAINT:  Chief Complaint  Patient presents with  . Changes in white matter    rm 13 New Pt, husband- Pat    HISTORY OF PRESENT ILLNESS:  I had the pleasure of seeing your patient, [name] [name], at [facility] for neurologic consultation   She is a 64 year old woman who has had eye movements abnormalities and was diagnosed with superior oblique myokymia.   This was initially on the right eye and now both, though right > left.    Some eyedrops helped at first but not anymore.    This occurs more while driving, especially if light shines through the trees.   She saw Dr. Frederico Hamman who diagnosed her with superior oblique myokymia.  When symptoms did not improve with beta-blocker eyedrops, he ordered an MRI of the brain.  It showed some white matter changes that are most consistent with chronic microvascular ischemic changes.  There were no dorsal midbrain abnormalities.  Vascular risks:   She has hyperlipidemia and has had intermittent hypertension.   She is not a smoker but had second hand smoke.      She is often nervous and has anxiety.  She does not note any association between the anxiety and the myokymia  I reviewed the actual MRI images from 03/23/2019 (MRI brain [facility]).  Brain volume is normal for age.  She has some scattered T2/FLAIR hyperdense foci in the subcortical deep white matter very consistent with chronic microvascular ischemic change.  The pattern is not typical for demyelination.  None of the changes appear to be acute.  The brain was otherwise normal.   I did not see a vascular loop adjacent to trochlear nerve but slices were thick and not optimized  for this.     REVIEW OF SYSTEMS: Constitutional: No fevers, chills, sweats, or change in appetite Eyes: As above. Ear, nose and throat: No hearing loss, ear pain, nasal congestion, sore throat Cardiovascular: No chest pain, palpitations Respiratory: No shortness of breath at rest or with exertion.   No wheezes GastrointestinaI: No nausea, vomiting, diarrhea, abdominal pain, fecal incontinence Genitourinary: No dysuria, urinary retention or frequency.  No nocturia. Musculoskeletal: No neck pain, back pain Integumentary:   she has psoriasis Neurological: as above Psychiatric: She has a history of depression and has anxiety. Endocrine: No palpitations, diaphoresis, change in appetite, change in weigh or increased thirst Hematologic/Lymphatic: No anemia, purpura, petechiae. Allergic/Immunologic: No itchy/runny eyes, nasal congestion, recent allergic reactions, rashes  ALLERGIES: Allergies  Allergen Reactions  . Grass Extracts [Gramineae Pollens]     HOME MEDICATIONS:  Current Outpatient Medications:  .  ALPRAZolam (XANAX) 1 MG tablet, Take 0.5 mg by mouth 2 (two) times daily. , Disp: , Rfl:  .  Cholecalciferol (VITAMIN D3) 1000 units CAPS, Take 1,000 Units by mouth daily at 10 pm., Disp: , Rfl:  .  dicyclomine (BENTYL) 10 MG capsule, TAKE 2 CAPSULES (20 MG TOTAL) BY MOUTH 2 (TWO) TIMES DAILY., Disp: 360 capsule, Rfl: 1 .  diphenoxylate-atropine (LOMOTIL) 2.5-0.025 MG tablet, TAKE 1 TABLET BY MOUTH 3 TIMES A DAY AS NEEDED FOR DIARRHEA OR LOOSE STOOL, Disp: 90 tablet, Rfl: 1 .  EPINEPHrine 0.3 mg/0.3 mL IJ SOAJ injection, , Disp: , Rfl:  .  escitalopram (LEXAPRO) 20 MG  tablet, Take 30 mg by mouth at bedtime. Takes 1.5 qhs, Disp: , Rfl:  .  fexofenadine (ALLEGRA) 180 MG tablet, Take 180 mg by mouth daily., Disp: , Rfl:  .  meloxicam (MOBIC) 15 MG tablet, TAKE 1 TABLET BY MOUTH ONCE A DAY, Disp: , Rfl:  .  pantoprazole (PROTONIX) 20 MG tablet, TAKE 1 TABLET BY MOUTH EVERY DAY, Disp: 90  tablet, Rfl: 3 .  PREMARIN vaginal cream, Place 1 Applicatorful vaginally at bedtime., Disp: , Rfl:  .  rosuvastatin (CRESTOR) 5 MG tablet, Take 5 mg by mouth daily., Disp: , Rfl:  .  OXcarbazepine (TRILEPTAL) 150 MG tablet, One po qAM and two po qHS, Disp: 90 tablet, Rfl: 5  PAST MEDICAL HISTORY: Past Medical History:  Diagnosis Date  . Allergy   . Arthritis   . Blood transfusion without reported diagnosis   . Generalized anxiety disorder   . Hyperlipidemia   . IBS (irritable bowel syndrome)   . Major depressive disorder   . Psoriasis     PAST SURGICAL HISTORY: Past Surgical History:  Procedure Laterality Date  . COLONOSCOPY  12/23/2017  . DILATION AND CURETTAGE OF UTERUS    . ESOPHAGOGASTRODUODENOSCOPY  04/22/2017   Erosive gastrtis. Otherwise normal EGD.   Marland Kitchen MASS EXCISION  09/11/2011   Procedure: EXCISION MASS;  Surgeon: Wyn Forster., MD;  Location: [facility];  Service: Orthopedics;  Laterality: Right;  excisional biopsy right hand     FAMILY HISTORY: Family History  Problem Relation Age of Onset  . Cancer Mother   . COPD Father   . Stroke Brother   . Colon cancer Neg Hx   . Stomach cancer Neg Hx   . Esophageal cancer Neg Hx     SOCIAL HISTORY:  Social History   Socioeconomic History  . Marital status: Significant Other    Spouse name: Dennie Bible  . Number of children: Not on file  . Years of education: 56  . Highest education level: High school graduate  Occupational History  . Occupation: Human resources officer  Tobacco Use  . Smoking status: Never Smoker  . Smokeless tobacco: Never Used  Substance and Sexual Activity  . Alcohol use: Yes    Alcohol/week: 14.0 standard drinks    Types: 14 Cans of beer per week    Comment: SOCIAL  . Drug use: No  . Sexual activity: Yes  Other Topics Concern  . Not on file  Social History Narrative   Right handed      Highest level of edu- 12th grade      Lives in two story home. Stays downstairs mainly.    Social Determinants of Health   Financial Resource Strain:   . Difficulty of Paying Living Expenses:   Food Insecurity:   . Worried About Programme researcher, broadcasting/film/video in the Last Year:   . Barista in the Last Year:   Transportation Needs:   . Freight forwarder (Medical):   Marland Kitchen Lack of Transportation (Non-Medical):   Physical Activity:   . Days of Exercise per Week:   . Minutes of Exercise per Session:   Stress:   . Feeling of Stress :   Social Connections:   . Frequency of Communication with Friends and Family:   . Frequency of Social Gatherings with Friends and Family:   . Attends Religious Services:   . Active Member of Clubs or Organizations:   . Attends Banker Meetings:   Marland Kitchen Marital  Status:   Intimate Partner Violence:   . Fear of Current or Ex-Partner:   . Emotionally Abused:   Marland Kitchen Physically Abused:   . Sexually Abused:      PHYSICAL EXAM  Vitals:   09/16/19 0857  BP: (!) 156/96  Pulse: 71  Temp: 97.9 F (36.6 C)  Weight: 163 lb 8 oz (74.2 kg)  Height: 5\' 2"  (1.575 m)    Body mass index is 29.9 kg/m.   General: The patient is well-developed and well-nourished and in no acute distress  HEENT:  Head is [place]/AT.  Sclera are anicteric.   Neck: No carotid bruits are noted.  The neck is nontender.  Cardiovascular: The heart has a regular rate and rhythm with a normal S1 and S2. There were no murmurs, gallops or rubs.    Skin: Extremities show some psoriasis changes and a sore on the left index finger.   Neurologic Exam  Mental status: The patient is alert and oriented x 3 at the time of the examination. The patient has apparent normal recent and remote memory, with an apparently normal attention span and concentration ability.   Speech is normal.  Cranial nerves: Extraocular movements are full. Pupils are equal, round, and reactive to light and accomodation.  Visual fields are full.  Facial symmetry is present. There is good facial sensation  to soft touch bilaterally.Facial strength is normal.  Trapezius and sternocleidomastoid strength is normal. No dysarthria is noted.   . No obvious hearing deficits are noted.  Motor:  Muscle bulk is normal.   Tone is normal. Strength is  5 / 5 in all 4 extremities.   Sensory: Sensory testing is intact to touch and vibration  Coordination: Cerebellar testing reveals good finger-nose-finger and heel-to-shin bilaterally.  Gait and station: Station is normal.   Gait is normal. Tandem gait is minimally wide. Romberg is negative.   Reflexes: Deep tendon reflexes are symmetric and normal bilaterally.   Plantar responses are flexor.    DIAGNOSTIC DATA (LABS, IMAGING, TESTING) - I reviewed patient records, labs, notes, testing and imaging myself where available.  Lab Results  Component Value Date   WBC 5.4 10/26/2018   HGB 13.0 10/26/2018   HCT 38.3 10/26/2018   MCV 95.5 10/26/2018   PLT 230.0 10/26/2018      Component Value Date/Time   NA 140 10/26/2018 1006   K 4.0 10/26/2018 1006   CL 102 10/26/2018 1006   CO2 30 10/26/2018 1006   GLUCOSE 111 (H) 10/26/2018 1006   BUN 9 10/26/2018 1006   CREATININE 0.72 10/26/2018 1006   CALCIUM 9.4 10/26/2018 1006   PROT 6.9 10/26/2018 1006   ALBUMIN 4.0 10/26/2018 1006   AST 31 10/26/2018 1006   ALT 27 10/26/2018 1006   ALKPHOS 89 10/26/2018 1006   BILITOT 0.5 10/26/2018 1006       ASSESSMENT AND PLAN  Oscillopsia  Superior oblique myokymia of right eye  Anxiety  In summary, Ms. Ballengee is a 64 year old woman who has had oscillopsia for the last year often while driving.  She has been diagnosed with superior oblique myokymia.  Initially her symptoms were entirely in the right eye but she has noted some symptoms in the left at times.  This disorder will sometimes resolve with antiepileptic medications.  Tegretol has the most evidence and the similar oxcarbazepine has some case reports and is usually better tolerated.  Therefore, I will  start her on oxcarbazepine and titrate her up to 150 mg in  the morning and 300 mg at night.  We will increase this dose as needed based on her response and tolerability.  If she does not tolerate this, then we will check a high-resolution MRI through the midbrain to better evaluate for the possibility of a vascular loop adjacent to the right trochlear nerve.  If this is found, surgical decompression is another option though we would likely try some other medications first.  I discussed the MRI findings with her and her husband.  The white matter changes are completely consistent with chronic microvascular ischemic change.  The extent is more than expected for age and she is advised to see her primary care about her hypertension (she reports that her blood pressure is fluctuates a lot).    She will return to see me in several months but is advised to call in about a month if she does not get a benefit from the oxcarbazepine.  Thank you for asking me to see Ms. Schwenke.  Please let me know if I can be of further assistance with her or other patients in the future.   Laurita Peron A. Epimenio Foot, MD, [facility] 09/16/2019, 10:15 AM Certified in Neurology, Clinical Neurophysiology, Sleep Medicine and Neuroimaging  [facility] [address] [place], [place] [address] [phone]

## 2019-11-24 ENCOUNTER — Encounter: Payer: BLUE CROSS/BLUE SHIELD | Admitting: Psychology

## 2019-12-01 ENCOUNTER — Encounter: Payer: BLUE CROSS/BLUE SHIELD | Admitting: Psychology

## 2019-12-23 DIAGNOSIS — D513 Other dietary vitamin B12 deficiency anemia: Secondary | ICD-10-CM | POA: Insufficient documentation

## 2019-12-28 ENCOUNTER — Telehealth: Payer: Self-pay

## 2019-12-28 NOTE — Telephone Encounter (Signed)
I spoke to the patient. She was inquiring about scheduling her ordered sleep study. In review of her chart, she was seen by [facility] in the past. The sleep study was placed by Dr. Karel Jarvis. She has also been seen at that office by Dr. Milbert Coulter. The patient was confused as to why she was seeing two neurology practices. I have discussed this with Dr. Epimenio Foot. The patient should continue follow up with [facility]. The patient may also follow up with them for her oscillopsia. She appreciated the clarification and will contact [facility] for a follow up appt.

## 2019-12-28 NOTE — Telephone Encounter (Signed)
Patient called to see if she had an upcoming appointment and I advised her that she does have a follow up scheduled on 01/23/20. Patient wasn't quite sure what she was seeing Dr. Epimenio Foot for so I reviewed his last OV note and reminded her what she had seen him for. She asked if she was supposed to be having a sleep study done because she hasn't gotten a call to schedule one yet and I didn't see that in his note anywhere so I checked the orders and noticed that a sleep study was ordered back in January by Dr. Karel Jarvis. Patient seems very confused as to who she's supposed to be seeing and who she should be following up with.

## 2020-01-11 ENCOUNTER — Ambulatory Visit: Payer: BLUE CROSS/BLUE SHIELD | Admitting: Neurology

## 2020-01-11 ENCOUNTER — Encounter: Payer: Self-pay | Admitting: Neurology

## 2020-01-11 VITALS — BP 152/97 | HR 66 | Ht 62.0 in | Wt 161.5 lb

## 2020-01-11 DIAGNOSIS — G514 Facial myokymia: Secondary | ICD-10-CM | POA: Diagnosis not present

## 2020-01-11 MED ORDER — LEVETIRACETAM 750 MG PO TABS
ORAL_TABLET | ORAL | 11 refills | Status: DC
Start: 2020-01-11 — End: 2020-08-02

## 2020-01-11 NOTE — Progress Notes (Signed)
[facility]  PATIENT: [name] PuntLinda Laughridge DOB: 01/27/1956  REFERRING DOCTOR OR PCP: Aura CampsMichael Spencer, MD (ophthalmology); Mauricio Poegina York (PCP) SOURCE: Patient, notes from Dr. Karleen HampshireSpencer, imaging reports, MRI images personally reviewed.  _________________________________   HISTORICAL  CHIEF COMPLAINT:  Chief Complaint  Patient presents with  . Follow-up    RM 13, with daguhter.  Last seen 09/16/2019. She is more clumsy in the last month. Falling because of this. Last fall about 2 weeks ago. Denies hitting head. Wanting to discuss treatment plan. Wants notes forwarded to Dr. Karleen HampshireSpencer.    HISTORY OF PRESENT ILLNESS:  [name] [name] is a 64 year old woman who has had eye movements abnormalities and was diagnosed with superior oblique myokymia.     Update 01/11/2020: After the last visit, oxcarbazepine was tried for her superior oblique myokymia but she did not note any benefit.    She notes it more when she is looking down.   She feels it more in the right eye but sometimes she feels it in the left eye as well.    She reports that she has some falls.   She has anxiety but she has not noted any connection between the anxiety and her myokymia/oscillopsia  I reviewed the actual MRI images from 03/23/2019 ([facility]).  Brain volume is normal for age.  She has some scattered T2/FLAIR hyperdense foci in the subcortical deep white matter very consistent with chronic microvascular ischemic change.  The pattern is not typical for demyelination.  None of the changes appear to be acute.  The brain was otherwise normal.   I did not see a vascular loop adjacent to trochlear nerve but slices were thick and not optimized for this.     Vascular risks:   She has hyperlipidemia and has had intermittent hypertension.   She is not a smoker but had second hand smoke.        REVIEW OF SYSTEMS: Constitutional: No fevers, chills, sweats, or change in appetite Eyes: As above. Ear, nose and  throat: No hearing loss, ear pain, nasal congestion, sore throat Cardiovascular: No chest pain, palpitations Respiratory: No shortness of breath at rest or with exertion.   No wheezes GastrointestinaI: No nausea, vomiting, diarrhea, abdominal pain, fecal incontinence Genitourinary: No dysuria, urinary retention or frequency.  No nocturia. Musculoskeletal: No neck pain, back pain Integumentary:   she has psoriasis Neurological: as above Psychiatric: She has a history of depression and has anxiety. Endocrine: No palpitations, diaphoresis, change in appetite, change in weigh or increased thirst Hematologic/Lymphatic: No anemia, purpura, petechiae. Allergic/Immunologic: No itchy/runny eyes, nasal congestion, recent allergic reactions, rashes  ALLERGIES: Allergies  Allergen Reactions  . Grass Extracts [Gramineae Pollens]     HOME MEDICATIONS:  Current Outpatient Medications:  .  ALPRAZolam (XANAX) 1 MG tablet, Take 0.5 mg by mouth 2 (two) times daily. , Disp: , Rfl:  .  cetirizine (ZYRTEC) 10 MG tablet, Take 10 mg by mouth daily., Disp: , Rfl:  .  Cholecalciferol (VITAMIN D3) 1000 units CAPS, Take 1,000 Units by mouth daily at 10 pm., Disp: , Rfl:  .  dicyclomine (BENTYL) 10 MG capsule, TAKE 2 CAPSULES (20 MG TOTAL) BY MOUTH 2 (TWO) TIMES DAILY., Disp: 360 capsule, Rfl: 1 .  diphenoxylate-atropine (LOMOTIL) 2.5-0.025 MG tablet, TAKE 1 TABLET BY MOUTH 3 TIMES A DAY AS NEEDED FOR DIARRHEA OR LOOSE STOOL, Disp: 90 tablet, Rfl: 1 .  EPINEPHrine 0.3 mg/0.3 mL IJ SOAJ injection, , Disp: , Rfl:  .  escitalopram (  LEXAPRO) 20 MG tablet, Take 30 mg by mouth at bedtime. Takes 1.5 qhs, Disp: , Rfl:  .  meloxicam (MOBIC) 15 MG tablet, TAKE 1 TABLET BY MOUTH ONCE A DAY, Disp: , Rfl:  .  OXcarbazepine (TRILEPTAL) 150 MG tablet, One po qAM and two po qHS, Disp: 90 tablet, Rfl: 5 .  pantoprazole (PROTONIX) 20 MG tablet, TAKE 1 TABLET BY MOUTH EVERY DAY, Disp: 90 tablet, Rfl: 3 .  PREMARIN vaginal  cream, Place 1 Applicatorful vaginally at bedtime., Disp: , Rfl:  .  rosuvastatin (CRESTOR) 5 MG tablet, Take 5 mg by mouth daily., Disp: , Rfl:  .  levETIRAcetam (KEPPRA) 750 MG tablet, For one week take 1/2 pill po bid then 1 pill po bid, Disp: 60 tablet, Rfl: 11  PAST MEDICAL HISTORY: Past Medical History:  Diagnosis Date  . Allergy   . Arthritis   . Blood transfusion without reported diagnosis   . Generalized anxiety disorder   . Hyperlipidemia   . IBS (irritable bowel syndrome)   . Major depressive disorder   . Psoriasis     PAST SURGICAL HISTORY: Past Surgical History:  Procedure Laterality Date  . COLONOSCOPY  12/23/2017  . DILATION AND CURETTAGE OF UTERUS    . ESOPHAGOGASTRODUODENOSCOPY  04/22/2017   Erosive gastrtis. Otherwise normal EGD.   Marland Kitchen MASS EXCISION  09/11/2011   Procedure: EXCISION MASS;  Surgeon: Wyn Forster., MD;  Location: [facility];  Service: Orthopedics;  Laterality: Right;  excisional biopsy right hand     FAMILY HISTORY: Family History  Problem Relation Age of Onset  . Cancer Mother   . COPD Father   . Stroke Brother   . Colon cancer Neg Hx   . Stomach cancer Neg Hx   . Esophageal cancer Neg Hx     SOCIAL HISTORY:  Social History   Socioeconomic History  . Marital status: Significant Other    Spouse name: Dennie Bible  . Number of children: Not on file  . Years of education: 70  . Highest education level: High school graduate  Occupational History  . Occupation: Human resources officer  Tobacco Use  . Smoking status: Never Smoker  . Smokeless tobacco: Never Used  Vaping Use  . Vaping Use: Never used  Substance and Sexual Activity  . Alcohol use: Yes    Alcohol/week: 14.0 standard drinks    Types: 14 Cans of beer per week    Comment: SOCIAL  . Drug use: No  . Sexual activity: Yes  Other Topics Concern  . Not on file  Social History Narrative   Right handed      Highest level of edu- 12th grade      Lives in two story  home. Stays downstairs mainly.   Social Determinants of Health   Financial Resource Strain:   . Difficulty of Paying Living Expenses:   Food Insecurity:   . Worried About Programme researcher, broadcasting/film/video in the Last Year:   . Barista in the Last Year:   Transportation Needs:   . Freight forwarder (Medical):   Marland Kitchen Lack of Transportation (Non-Medical):   Physical Activity:   . Days of Exercise per Week:   . Minutes of Exercise per Session:   Stress:   . Feeling of Stress :   Social Connections:   . Frequency of Communication with Friends and Family:   . Frequency of Social Gatherings with Friends and Family:   . Attends Religious Services:   .  Active Member of Clubs or Organizations:   . Attends Banker Meetings:   Marland Kitchen Marital Status:   Intimate Partner Violence:   . Fear of Current or Ex-Partner:   . Emotionally Abused:   Marland Kitchen Physically Abused:   . Sexually Abused:      PHYSICAL EXAM  Vitals:   01/11/20 1530  BP: (!) 152/97  Pulse: 66  Weight: 161 lb 8 oz (73.3 kg)  Height: 5\' 2"  (1.575 m)    Body mass index is 29.54 kg/m.   General: The patient is well-developed and well-nourished and in no acute distress  HEENT:  Head is [place]/AT.  Sclera are anicteric.   Neurologic Exam  Mental status: The patient is alert and oriented x 3 at the time of the examination. The patient has apparent normal recent and remote memory, with an apparently normal attention span and concentration ability.   Speech is normal.  Cranial nerves: Extraocular movements are full.  Specifically, I did not see any mild even when she noted symptoms of it..  Facial symmetry is present. Facial strength is normal.  Trapezius and sternocleidomastoid strength is normal. No dysarthria is noted.   . No obvious hearing deficits are noted.  Motor:  Muscle bulk is normal.   Tone is normal. Strength is  5 / 5 in all 4 extremities.   Sensory: Sensory testing is intact to touch and  vibration  Coordination: Cerebellar testing reveals good finger-nose-finger and heel-to-shin bilaterally.  Gait and station: Station is normal.   Gait is normal. Tandem gait is minimally wide. Romberg is negative.   Reflexes: Deep tendon reflexes are symmetric and normal bilaterally.   Plantar responses are flexor.    DIAGNOSTIC DATA (LABS, IMAGING, TESTING) - I reviewed patient records, labs, notes, testing and imaging myself where available.  Lab Results  Component Value Date   WBC 5.4 10/26/2018   HGB 13.0 10/26/2018   HCT 38.3 10/26/2018   MCV 95.5 10/26/2018   PLT 230.0 10/26/2018      Component Value Date/Time   NA 140 10/26/2018 1006   K 4.0 10/26/2018 1006   CL 102 10/26/2018 1006   CO2 30 10/26/2018 1006   GLUCOSE 111 (H) 10/26/2018 1006   BUN 9 10/26/2018 1006   CREATININE 0.72 10/26/2018 1006   CALCIUM 9.4 10/26/2018 1006   PROT 6.9 10/26/2018 1006   ALBUMIN 4.0 10/26/2018 1006   AST 31 10/26/2018 1006   ALT 27 10/26/2018 1006   ALKPHOS 89 10/26/2018 1006   BILITOT 0.5 10/26/2018 1006       ASSESSMENT AND PLAN  Superior oblique myokymia of right eye - Plan: Ambulatory referral to Ophthalmology  1.  She has been diagnosed with superior oblique myokymia.  Unfortunately, I have not actually seen any of the abnormal eye movements doing her initial examination of this examination, even when she feels symptomatic.  Some patients with this syndrome respond to antiepilepsy agents.  She did not get a benefit from oxcarbazepine so I will have her try levetiracetam. 2.   There was no source of her symptoms on my review of the MRI.  I would like her to see a neuro-ophthalmologist for further evaluation and possible treatment treatment (Botox is also sometimes used).  We also discussed that there are some references in the literature to decompressive surgery if a vascular loop is noted adjacent to the trochlear nerve. 3.   No follow-up was scheduled at this time.  She is  advised to  follow-up with neuro-ophthalmology. Arshi Duarte A. Epimenio Foot, MD, [facility] 01/11/2020, 6:35 PM Certified in Neurology, Clinical Neurophysiology, Sleep Medicine and Neuroimaging  [facility] [address], [address] [place], [place] [address] [phone]

## 2020-01-23 ENCOUNTER — Ambulatory Visit: Payer: BLUE CROSS/BLUE SHIELD | Admitting: Neurology

## 2020-01-26 ENCOUNTER — Other Ambulatory Visit: Payer: Self-pay | Admitting: Gastroenterology

## 2020-02-08 ENCOUNTER — Emergency Department (HOSPITAL_COMMUNITY)
Admission: EM | Admit: 2020-02-08 | Discharge: 2020-02-09 | Disposition: A | Discharge status: Home / Self Care | Payer: BLUE CROSS/BLUE SHIELD | Attending: Emergency Medicine | Admitting: Emergency Medicine

## 2020-02-08 ENCOUNTER — Encounter (HOSPITAL_COMMUNITY): Payer: Self-pay | Admitting: Emergency Medicine

## 2020-02-08 ENCOUNTER — Other Ambulatory Visit: Payer: Self-pay

## 2020-02-08 ENCOUNTER — Emergency Department (HOSPITAL_COMMUNITY): Payer: BLUE CROSS/BLUE SHIELD

## 2020-02-08 DIAGNOSIS — I10 Essential (primary) hypertension: Secondary | ICD-10-CM | POA: Diagnosis not present

## 2020-02-08 DIAGNOSIS — H52531 Spasm of accommodation, right eye: Secondary | ICD-10-CM | POA: Insufficient documentation

## 2020-02-08 DIAGNOSIS — R42 Dizziness and giddiness: Secondary | ICD-10-CM | POA: Diagnosis present

## 2020-02-08 DIAGNOSIS — Z79899 Other long term (current) drug therapy: Secondary | ICD-10-CM | POA: Insufficient documentation

## 2020-02-08 LAB — APTT: aPTT: 28 seconds (ref 24–36)

## 2020-02-08 LAB — I-STAT CHEM 8, ED
BUN: 15 mg/dL (ref 8–23)
Calcium, Ion: 1.24 mmol/L (ref 1.15–1.40)
Chloride: 103 mmol/L (ref 98–111)
Creatinine, Ser: 0.7 mg/dL (ref 0.44–1.00)
Glucose, Bld: 105 mg/dL — ABNORMAL HIGH (ref 70–99)
HCT: 42 % (ref 36.0–46.0)
Hemoglobin: 14.3 g/dL (ref 12.0–15.0)
Potassium: 4 mmol/L (ref 3.5–5.1)
Sodium: 142 mmol/L (ref 135–145)
TCO2: 28 mmol/L (ref 22–32)

## 2020-02-08 LAB — DIFFERENTIAL
Abs Immature Granulocytes: 0.02 10*3/uL (ref 0.00–0.07)
Basophils Absolute: 0.1 10*3/uL (ref 0.0–0.1)
Basophils Relative: 1 %
Eosinophils Absolute: 0.2 10*3/uL (ref 0.0–0.5)
Eosinophils Relative: 2 %
Immature Granulocytes: 0 %
Lymphocytes Relative: 37 %
Lymphs Abs: 2.4 10*3/uL (ref 0.7–4.0)
Monocytes Absolute: 0.5 10*3/uL (ref 0.1–1.0)
Monocytes Relative: 8 %
Neutro Abs: 3.3 10*3/uL (ref 1.7–7.7)
Neutrophils Relative %: 52 %

## 2020-02-08 LAB — COMPREHENSIVE METABOLIC PANEL
ALT: 26 U/L (ref 0–44)
AST: 24 U/L (ref 15–41)
Albumin: 4.1 g/dL (ref 3.5–5.0)
Alkaline Phosphatase: 89 U/L (ref 38–126)
Anion gap: 11 (ref 5–15)
BUN: 13 mg/dL (ref 8–23)
CO2: 29 mmol/L (ref 22–32)
Calcium: 9.9 mg/dL (ref 8.9–10.3)
Chloride: 102 mmol/L (ref 98–111)
Creatinine, Ser: 0.74 mg/dL (ref 0.44–1.00)
GFR calc Af Amer: >60 mL/min (ref >60)
GFR calc non Af Amer: >60 mL/min (ref >60)
Glucose, Bld: 108 mg/dL — ABNORMAL HIGH (ref 70–99)
Potassium: 4 mmol/L (ref 3.5–5.1)
Sodium: 142 mmol/L (ref 135–145)
Total Bilirubin: 0.2 mg/dL — ABNORMAL LOW (ref 0.3–1.2)
Total Protein: 7.4 g/dL (ref 6.5–8.1)

## 2020-02-08 LAB — CBC
HCT: 42.3 % (ref 36.0–46.0)
Hemoglobin: 13.9 g/dL (ref 12.0–15.0)
MCH: 31.7 pg (ref 26.0–34.0)
MCHC: 32.9 g/dL (ref 30.0–36.0)
MCV: 96.6 fL (ref 80.0–100.0)
Platelets: 271 10*3/uL (ref 150–400)
RBC: 4.38 MIL/uL (ref 3.87–5.11)
RDW: 13.2 % (ref 11.5–15.5)
WBC: 6.5 10*3/uL (ref 4.0–10.5)
nRBC: 0 % (ref 0.0–0.2)

## 2020-02-08 LAB — PROTIME-INR
INR: 1 (ref 0.8–1.2)
Prothrombin Time: 12.7 seconds (ref 11.4–15.2)

## 2020-02-08 MED ORDER — SODIUM CHLORIDE 0.9% FLUSH
3.0000 mL | Freq: Once | INTRAVENOUS | Status: DC
Start: 2020-02-08 — End: 2020-02-09

## 2020-02-08 NOTE — ED Triage Notes (Addendum)
Onset today patient feeling off balanced and dizzy. States taking a new medication Levetiracetam slowly increasing dose last increase one week ago. States suppose to see a specialist for her eye twitching and that is why she is taking this medication. States is nervous taking a lot of different medications and worry about her eyes twitching.

## 2020-02-09 NOTE — Discharge Instructions (Addendum)
Please return to the emergency department with any new or worsening symptoms.  It was a pleasure to meet you. 

## 2020-02-09 NOTE — ED Provider Notes (Signed)
[facility] EMERGENCY DEPARTMENT Provider Note   CSN: [id number] Arrival date & time: 02/08/20  1332     History Chief Complaint  Patient presents with  . Gait Problem    [name] [name] is a 64 y.o. female.  HPI Patient is a 64 year old female with a history of MDD, anxiety, psoriasis, oscillopsia, superior oblique myokymia of the right eye, who presents due to episodes of worsening dizziness.  Patient states in the last 2 to 3 years she experiences episodes of intermittent right eye twitching that cause her to "feel off balance" and many times drift to the right.  Seem to be worse with movements as well as when operating a motor vehicle, per patient.  She feels as if her symptoms have been progressively worsening over the last 2 to 3 years and more recently began involving the left eye as well.  She was referred to Dr. Epimenio Foot and begin taking levetiracetam about 2 weeks ago.  Since beginning this medication she feels as if her symptoms have significantly worsened.  Yesterday while at home, she felt as if her symptoms were intermittently occurring all day which is much worse than normal for her which is why she came to the emergency department.  Patient's most recent MRI of the brain was on October 7 of last year which showed a normal brain volume with some scattered T2/flair hyperdense foci in the subcortical deep white matter which was consistent with chronic microvascular ischemic changes. This was reviewed by neurology at her most recent appointment. Per records patient also took oxcarbazepine in the past but had no significant relief from this medication so it was discontinued.  She was referred to a neuro-ophthalmologist with [facility] and has a follow-up appointment with them in about 1 month.  Patient has no other acute complaints at this time.  No fevers, chills, URI symptoms, shortness of breath, abdominal pain, vomiting, urinary changes, syncope.    Past Medical History:   Diagnosis Date  . Allergy   . Arthritis   . Blood transfusion without reported diagnosis   . Generalized anxiety disorder   . Hyperlipidemia   . IBS (irritable bowel syndrome)   . Major depressive disorder   . Psoriasis     Patient Active Problem List   Diagnosis Date Noted  . 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 Superior oblique myokymia of right eye 09/16/2019  . Anxiety 09/16/2019  . Generalized anxiety disorder   . Major depressive disorder   . Dermatitis 11/29/2018  . Rash 11/29/2018  . Calcaneal spur of right foot 08/25/2017  . Plantar fasciitis of right foot 08/25/2017  . Closed displaced fracture of middle phalanx of left little finger 12/09/2016  . Acquired deformity of foot 01/28/2014  . Enthesopathy of ankle and tarsus 01/28/2014  . Hemorrhoids 01/28/2014  . Hot flashes, menopausal 01/28/2014    Past Surgical History:  Procedure Laterality Date  . COLONOSCOPY  12/23/2017  . DILATION AND CURETTAGE OF UTERUS    . ESOPHAGOGASTRODUODENOSCOPY  04/22/2017   Erosive gastrtis. Otherwise normal EGD.   Marland Kitchen MASS EXCISION  09/11/2011   Procedure: EXCISION MASS;  Surgeon: Wyn Forster., MD;  Location: [facility];  Service: Orthopedics;  Laterality: Right;  excisional biopsy right hand      OB History   No obstetric history on file.     Family History  Problem Relation Age of Onset  . Cancer Mother   . COPD Father   . Stroke Brother   .  Colon cancer Neg Hx   . Stomach cancer Neg Hx   . Esophageal cancer Neg Hx     Social History   Tobacco Use  . Smoking status: Never Smoker  . Smokeless tobacco: Never Used  Vaping Use  . Vaping Use: Never used  Substance Use Topics  . Alcohol use: Yes    Alcohol/week: 14.0 standard drinks    Types: 14 Cans of beer per week    Comment: SOCIAL  . Drug use: No    Home Medications Prior to Admission medications   Medication Sig Start Date End Date Taking? Authorizing Provider  ALPRAZolam Prudy Feeler(XANAX) 1 MG tablet  Take 0.5 mg by mouth 2 (two) times daily.     [provider]  cetirizine (ZYRTEC) 10 MG tablet Take 10 mg by mouth daily.    [provider]  Cholecalciferol (VITAMIN D3) 1000 units CAPS Take 1,000 Units by mouth daily at 10 pm.    [provider]  dicyclomine (BENTYL) 10 MG capsule TAKE 2 CAPSULES (20 MG TOTAL) BY MOUTH 2 (TWO) TIMES DAILY. 08/01/19   Lynann BolognaGupta, Rajesh, MD  diphenoxylate-atropine (LOMOTIL) 2.5-0.025 MG tablet TAKE 1 TABLET BY MOUTH 3 TIMES A DAY AS NEEDED FOR DIARRHEA OR LOOSE STOOL 10/25/18   Lynann BolognaGupta, Rajesh, MD  EPINEPHrine 0.3 mg/0.3 mL IJ SOAJ injection  12/05/14   [provider]  escitalopram (LEXAPRO) 20 MG tablet Take 30 mg by mouth at bedtime. Takes 1.5 qhs    [provider]  levETIRAcetam (KEPPRA) 750 MG tablet For one week take 1/2 pill po bid then 1 pill po bid 01/11/20   Sater, Pearletha Furlichard A, MD  meloxicam (MOBIC) 15 MG tablet TAKE 1 TABLET BY MOUTH ONCE A DAY 01/07/16   [provider]  OXcarbazepine (TRILEPTAL) 150 MG tablet One po qAM and two po qHS 09/16/19   Sater, Pearletha Furlichard A, MD  pantoprazole (PROTONIX) 20 MG tablet TAKE 1 TABLET BY MOUTH EVERY DAY 02/28/19   Lynann BolognaGupta, Rajesh, MD  PREMARIN vaginal cream Place 1 Applicatorful vaginally at bedtime. 08/09/19   [provider]  rosuvastatin (CRESTOR) 5 MG tablet Take 5 mg by mouth daily.    [provider]    Allergies    Grass extracts [gramineae pollens]  Review of Systems   Review of Systems  All other systems reviewed and are negative. Ten systems reviewed and are negative for acute change, except as noted in the HPI.    Physical Exam Updated Vital Signs BP (!) 143/82 (BP Location: Right Arm)   Pulse 73   Temp 98 F (36.7 C) (Oral)   Resp 16   Ht 5\' 2"  (1.575 m)   Wt 72.6 kg   SpO2 98%   BMI 29.26 kg/m   Physical Exam Vitals and nursing note reviewed.  Constitutional:      General: She is not in acute distress.    Appearance: Normal  appearance. She is normal weight. She is not ill-appearing, toxic-appearing or diaphoretic.     Comments: Pleasant well-developed female lying in the semi-Fowlers position.  She speaks clearly and coherently.  HENT:     Head: Normocephalic and atraumatic.     Right Ear: External ear normal.     Left Ear: External ear normal.     Nose: Nose normal.     Mouth/Throat:     Mouth: Mucous membranes are moist.     Pharynx: Oropharynx is clear. No oropharyngeal exudate or posterior oropharyngeal erythema.  Eyes:  General: No scleral icterus.       Right eye: No discharge.        Left eye: No discharge.     Extraocular Movements: Extraocular movements intact.     Conjunctiva/sclera: Conjunctivae normal.     Pupils: Pupils are equal, round, and reactive to light.     Comments: Pupils are equal, round, reactive to light.  Her extraocular movements are intact.  No visible signs of nystagmus or fasciculation.  Visual fields intact bilaterally.  Cardiovascular:     Rate and Rhythm: Normal rate and regular rhythm.     Pulses: Normal pulses.     Heart sounds: Normal heart sounds. No murmur heard.  No friction rub. No gallop.   Pulmonary:     Effort: Pulmonary effort is normal. No respiratory distress.     Breath sounds: Normal breath sounds. No stridor. No wheezing, rhonchi or rales.  Abdominal:     General: Abdomen is flat.     Tenderness: There is no abdominal tenderness.  Musculoskeletal:        General: Normal range of motion.     Cervical back: Normal range of motion and neck supple. No tenderness.     Right lower leg: No edema.     Left lower leg: No edema.  Skin:    General: Skin is warm and dry.  Neurological:     General: No focal deficit present.     Mental Status: She is alert and oriented to person, place, and time.     Comments: Patient is oriented to person, place, and time. Patient phonates in clear, complete, and coherent sentences. Negative arm drift. Finger to nose intact  bilaterally with no visible signs of dysmetria. Strength is 5/5 in all four extremities. Distal sensation intact in all four extremities.  Patient is able to ambulate with a steady gait.  Psychiatric:        Mood and Affect: Mood normal.        Behavior: Behavior normal.    ED Results / Procedures / Treatments   Labs (all labs ordered are listed, but only abnormal results are displayed) Labs Reviewed  COMPREHENSIVE METABOLIC PANEL - Abnormal; Notable for the following components:      Result Value   Glucose, Bld 108 (*)    Total Bilirubin 0.2 (*)    All other components within normal limits  I-STAT CHEM 8, ED - Abnormal; Notable for the following components:   Glucose, Bld 105 (*)    All other components within normal limits  PROTIME-INR  APTT  CBC  DIFFERENTIAL  CBG MONITORING, ED   EKG None  Radiology CT HEAD WO CONTRAST  Result Date: 02/08/2020 CLINICAL DATA:  Dizziness EXAM: CT HEAD WITHOUT CONTRAST TECHNIQUE: Contiguous axial images were obtained from the base of the skull through the vertex without intravenous contrast. COMPARISON:  None. FINDINGS: Brain: No evidence of acute infarction, hemorrhage, hydrocephalus, extra-axial collection or mass lesion/mass effect. Vascular: No hyperdense vessel or unexpected calcification. Skull: Normal. Negative for fracture or focal lesion. Sinuses/Orbits: No acute finding. Other: None. IMPRESSION: No acute intracranial abnormality noted. Electronically Signed   By: Alcide Clever M.D.   On: 02/08/2020 16:04   Procedures Procedures   Medications Ordered in ED Medications  sodium chloride flush (NS) 0.9 % injection 3 mL (has no administration in time range)   ED Course  I have reviewed the triage vital signs and the nursing notes.  Pertinent labs & imaging results that were available  during my care of the patient were reviewed by me and considered in my medical decision making (see chart for details).  Clinical Course as of Feb 09 732  Thu Feb 09, 2020  0711 No acute abnormalities  CT HEAD WO CONTRAST [LJ]    Clinical Course User Index [LJ] Placido Sou, PA-C   MDM Rules/Calculators/A&P                          Pt is a 64 y.o. female that presents with a history, physical exam, and ED Clinical Course as noted above.   Patient presents today with worsening episodes of feeling "off balance".  She has a history of superior oblique myokymia of the right eye.  She was recently evaluated by Dr. Epimenio Foot with neurology who started the patient on levetiracetam.  She has been taking this for a few weeks and states that her symptoms have been worsening since starting this medication.  Her lab work as well as CT imaging of her head are all reassuring today.  She has been mildly hypertensive but otherwise her vital signs have been stable.  Her physical exam today was also reassuring and her neurological exam is benign. Pt has a steady gait and ambulated with me throughout the ER.   Given patient's age and symptoms discussed with my attending physician Dr. Linwood Dibbles who agrees with the above plan.   I have urged the patient to keep her follow-up appointment with neuro-ophthalmology next month.  Recommended the patient stop taking her levetiracetam since it appears to be worsening her symptoms.  She is planning on following up with Dr. Epimenio Foot regarding her symptoms as well as this visit.  She understands she can always return to the emergency department with new or worsening symptoms.  Her questions were answered and she was amicable to time of discharge.  Her vital signs are stable.   An After Visit Summary was printed and given to the patient.  Patient discharged to home/self care.  Condition at discharge: Stable  Note: Portions of this report may have been transcribed using voice recognition software. Every effort was made to ensure accuracy; however, inadvertent computerized transcription errors may be present.   Final  Clinical Impression(s) / ED Diagnoses Final diagnoses:  Dizziness   Rx / DC Orders ED Discharge Orders    None       Placido Sou, PA-C 02/09/20 0757    Linwood Dibbles, MD 02/09/20 [phone]

## 2020-03-06 ENCOUNTER — Telehealth: Payer: Self-pay | Admitting: Neurology

## 2020-03-06 NOTE — Telephone Encounter (Signed)
Pt called, [facility] do not accept my insurance. Do you want me to go somewhere that accepts my insurance or go back to Dr Karleen Hampshire to get glass? Would like a call from the nurse.

## 2020-03-06 NOTE — Telephone Encounter (Signed)
Dr. Epimenio Foot would like this patient to see a neuro-ophthalmologist for further evaluation. He would like to try to get this set up at [facility]. If [facility] does not accept her insurance, then [facility] would be the next option.

## 2020-03-08 NOTE — Telephone Encounter (Signed)
Referral has been faxed to Dr. Theone Murdoch Apt Feb. 25 th at 9:00 am . Patient is on CX list . Thanks Annabelle Harman [phone] Fax [phone]

## 2020-03-15 ENCOUNTER — Other Ambulatory Visit: Payer: Self-pay | Admitting: Neurology

## 2020-03-18 ENCOUNTER — Other Ambulatory Visit: Payer: Self-pay | Admitting: Gastroenterology

## 2020-03-18 ENCOUNTER — Other Ambulatory Visit: Payer: Self-pay | Admitting: Neurology

## 2020-03-21 ENCOUNTER — Other Ambulatory Visit: Payer: Self-pay | Admitting: Gastroenterology

## 2020-07-26 ENCOUNTER — Telehealth: Payer: Self-pay | Admitting: Neurology

## 2020-07-26 NOTE — Telephone Encounter (Signed)
Patient was very confused today went to wrong placed I will get her resh.   Faxed referral and office notes to [facility], [phone]. Phone number to call for status or questions is [phone].

## 2020-08-01 NOTE — Telephone Encounter (Signed)
  Patient called and left a voice mail  Stating  Is there anything we can do to help with her eye.   Patient missed her apt . With [facility].  Patient cant' be seen until June .   [facility] can't see her because she is out - of Network .

## 2020-08-02 ENCOUNTER — Other Ambulatory Visit: Payer: Self-pay | Admitting: *Deleted

## 2020-08-02 MED ORDER — PROPRANOLOL HCL ER 60 MG PO CP24: 60.0000 mg | ORAL_CAPSULE | Freq: Every day | ORAL | 3 refills | Status: DC

## 2020-08-02 NOTE — Telephone Encounter (Signed)
Pt returned call. Please call back when available. 

## 2020-08-02 NOTE — Telephone Encounter (Signed)
Called pt back. Relayed Dr. Bonnita Hollow recommendation. She is agreeable to plan. I d/c'd levetiracetam on file and e-scribed propranolol. She will call eye doctor to see if there is another eye drop they would recommend, current one not helping. She will also call [facility] weekly to see about cx to try and get sooner appt. She will call back if she has any further questions/concerns.

## 2020-08-02 NOTE — Telephone Encounter (Signed)
LVM for pt to call office back.

## 2020-08-02 NOTE — Telephone Encounter (Signed)
Dr. Epimenio Foot reviewed pt chart. He recommends having her stop Keppra and change her medication to propranolol LA 60mg  po qd to see if this helps until she can get it in to see neurophthalmology

## 2020-08-06 ENCOUNTER — Telehealth: Payer: Self-pay | Admitting: Neurology

## 2020-08-06 NOTE — Telephone Encounter (Signed)
Pt called stating she would like to discuss with the RN about her propranolol ER (INDERAL LA) 60 MG 24 hr capsule Pt states that when she took the medication she became ill and started vomiting and she would like to know if the medication had something to do with it. Please advise.

## 2020-08-06 NOTE — Telephone Encounter (Signed)
That would be an unusual side effect from the medication.  The most common side effect is lightheadedness.  Some people feel little tired on the medicine.  She could try it one more time but if she gets the same reaction she should stop

## 2020-08-06 NOTE — Telephone Encounter (Signed)
Dr. Sater- what would you recommend? 

## 2020-08-06 NOTE — Telephone Encounter (Signed)
Called patient back. Relayed Dr. Bonnita Hollow message. Her stomach still feels a little upset today. Was able to eat toast this am and peanut butter sandwhich for lunch. Denies being around any one that was sick recently. Did eat medium rare steak day she got sick after taking propranolol. She is going to try to take it again tomorrow and she will let us know how she tolerates it.

## 2020-09-11 ENCOUNTER — Telehealth: Payer: Self-pay | Admitting: Neurology

## 2020-09-11 NOTE — Telephone Encounter (Signed)
Called pt back. Advised she was placed on propranolol until she could get in to see neurophthalmology. She has MRI this Sat and appt with them on 09/19/20. She will stay on medication until she can speak with them about next steps. Nothing further needed.

## 2020-09-11 NOTE — Telephone Encounter (Signed)
Pt is asking if this propranolol ER (INDERAL LA) 60 MG 24 hr capsule is something she needs to continue to take, please call.

## 2020-09-21 DIAGNOSIS — R7309 Other abnormal glucose: Secondary | ICD-10-CM | POA: Insufficient documentation

## 2020-10-27 ENCOUNTER — Other Ambulatory Visit: Payer: Self-pay | Admitting: Neurology

## 2020-11-13 ENCOUNTER — Other Ambulatory Visit: Payer: Self-pay | Admitting: Neurology

## 2020-11-13 ENCOUNTER — Other Ambulatory Visit: Payer: Self-pay | Admitting: Gastroenterology

## 2021-01-01 ENCOUNTER — Other Ambulatory Visit: Payer: Self-pay | Admitting: Gastroenterology

## 2021-01-15 DIAGNOSIS — U071 COVID-19: Secondary | ICD-10-CM | POA: Insufficient documentation

## 2021-02-07 ENCOUNTER — Encounter: Payer: Self-pay | Admitting: Gastroenterology

## 2021-02-15 ENCOUNTER — Ambulatory Visit: Payer: BLUE CROSS/BLUE SHIELD | Admitting: Gastroenterology

## 2021-02-19 DIAGNOSIS — M7661 Achilles tendinitis, right leg: Secondary | ICD-10-CM | POA: Insufficient documentation

## 2021-02-22 ENCOUNTER — Telehealth: Payer: Self-pay | Admitting: Gastroenterology

## 2021-02-22 NOTE — Telephone Encounter (Signed)
As inbound call states, patient is questioning if she can do colorguard instead of colonoscopy. Please advise, thank you

## 2021-02-22 NOTE — Telephone Encounter (Signed)
Inbound call from patient requesting a call from a nurse please.  Wants to know if she would be able to do the Cologurad instead of the colonoscopy.  Please advise.

## 2021-02-24 NOTE — Telephone Encounter (Signed)
No problems Please go ahead and set her up for Cologuard RG

## 2021-02-27 ENCOUNTER — Other Ambulatory Visit: Payer: Self-pay | Admitting: *Deleted

## 2021-02-27 DIAGNOSIS — K648 Other hemorrhoids: Secondary | ICD-10-CM

## 2021-02-27 NOTE — Telephone Encounter (Signed)
Spoke with patient. Patient is aware Cologuard has been ordered and available. Pt verbalized understanding , nothing further needed at this time.

## 2021-03-05 DIAGNOSIS — Z9889 Other specified postprocedural states: Secondary | ICD-10-CM | POA: Insufficient documentation

## 2021-03-12 ENCOUNTER — Encounter: Payer: BLUE CROSS/BLUE SHIELD | Admitting: Gastroenterology

## 2021-03-14 ENCOUNTER — Encounter: Payer: BLUE CROSS/BLUE SHIELD | Admitting: Gastroenterology

## 2021-03-20 ENCOUNTER — Encounter: Payer: Self-pay | Admitting: Gastroenterology

## 2021-04-18 ENCOUNTER — Encounter: Payer: BLUE CROSS/BLUE SHIELD | Admitting: Gastroenterology

## 2021-04-29 LAB — COLOGUARD: COLOGUARD: POSITIVE — AB

## 2021-04-30 ENCOUNTER — Other Ambulatory Visit: Payer: Self-pay

## 2021-04-30 DIAGNOSIS — R195 Other fecal abnormalities: Secondary | ICD-10-CM

## 2021-05-06 DIAGNOSIS — R5383 Other fatigue: Secondary | ICD-10-CM | POA: Insufficient documentation

## 2021-05-08 ENCOUNTER — Ambulatory Visit (AMBULATORY_SURGERY_CENTER): Payer: BLUE CROSS/BLUE SHIELD

## 2021-05-08 VITALS — Ht 62.0 in | Wt 160.0 lb

## 2021-05-08 DIAGNOSIS — R195 Other fecal abnormalities: Secondary | ICD-10-CM

## 2021-05-08 NOTE — Progress Notes (Signed)
  Patient's pre-visit was done today over the phone with the patient   Name,DOB and address verified.   Patient denies any allergies to Eggs and Soy.  Patient denies any problems with anesthesia/sedation. Patient denies taking diet pills or blood thinners.  Denies atrial flutter or atrial fib Denies chronic constipation No home Oxygen.   Packet of Prep instructions mailed to patient including a copy of a consent form-pt is aware.  Patient understands to call us back with any questions or concerns.  Patient is aware of our care-partner policy and Covid-19 safety protocol.      Pt did not have med list with her.  Reviewed med list as well as possible, but pt was not always sure about what she was asked.   Pt will bring med list the day of her procedure.

## 2021-05-21 ENCOUNTER — Telehealth: Payer: Self-pay | Admitting: Gastroenterology

## 2021-05-21 NOTE — Telephone Encounter (Signed)
Returned pt's call.  Patient was confused as to the steps of her procedure.  From when [facility] did PV, pt's procedure date was changed with no updated copy of prep given to her.  Pt undated her current copy guided by the nurse to fit a 2 day prep for a procedure date of 12/15.  New copy placed in the mail for the patient.  Offered pt to leave a copy to be picked up, but she preferred to have it mailed.  Pt has no My Chart.

## 2021-05-21 NOTE — Telephone Encounter (Signed)
Inbound call from patient, states that she has gotten 3 different letters in the mail from having to reschedule her procedure. She is seeking advise on which one she should follow and if she is getting another one. Requested a call back to advise.

## 2021-05-23 DIAGNOSIS — E782 Mixed hyperlipidemia: Secondary | ICD-10-CM | POA: Insufficient documentation

## 2021-05-23 DIAGNOSIS — R6882 Decreased libido: Secondary | ICD-10-CM | POA: Insufficient documentation

## 2021-05-23 DIAGNOSIS — M2550 Pain in unspecified joint: Secondary | ICD-10-CM | POA: Insufficient documentation

## 2021-05-23 DIAGNOSIS — E663 Overweight: Secondary | ICD-10-CM | POA: Insufficient documentation

## 2021-05-23 DIAGNOSIS — K219 Gastro-esophageal reflux disease without esophagitis: Secondary | ICD-10-CM | POA: Insufficient documentation

## 2021-05-23 DIAGNOSIS — F330 Major depressive disorder, recurrent, mild: Secondary | ICD-10-CM | POA: Insufficient documentation

## 2021-05-28 ENCOUNTER — Encounter: Payer: BLUE CROSS/BLUE SHIELD | Admitting: Gastroenterology

## 2021-05-30 ENCOUNTER — Encounter: Payer: BLUE CROSS/BLUE SHIELD | Admitting: Gastroenterology

## 2021-06-20 IMAGING — CT CT HEAD W/O CM
4 series · 17 of 47 positions shown, 19 images · non-contrast
Comparison: None.

CLINICAL DATA: Dizziness

EXAM:
CT HEAD WITHOUT CONTRAST
TECHNIQUE: Contiguous axial images were obtained from the base of the skull
through the vertex without intravenous contrast.

[Series 3: head wo · axial · 0.39mm/px · z∈[-142,-22]mm · 7 of 32 slices shown, 9 images]
[im 4/32  brain]
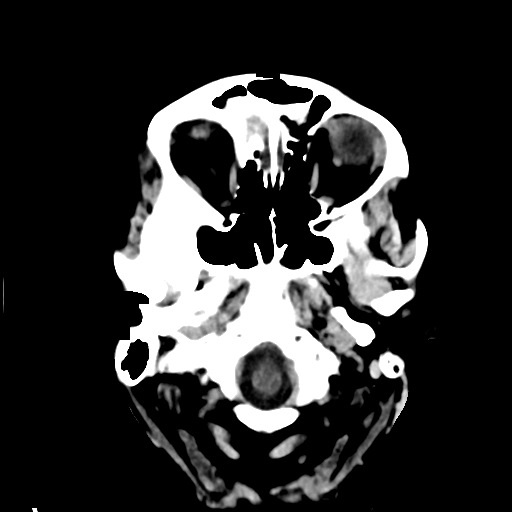
[im 4/32  bone]
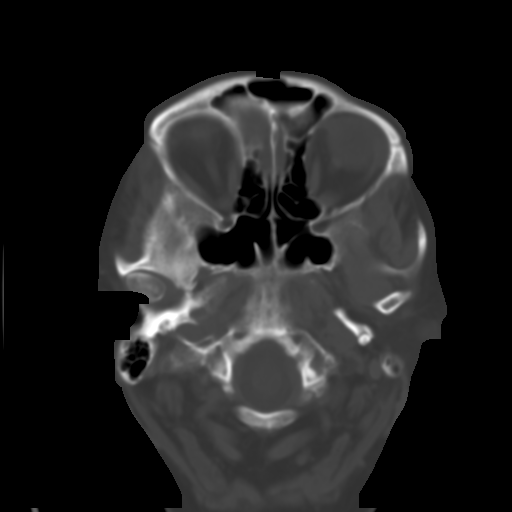
[im 8/32  brain]
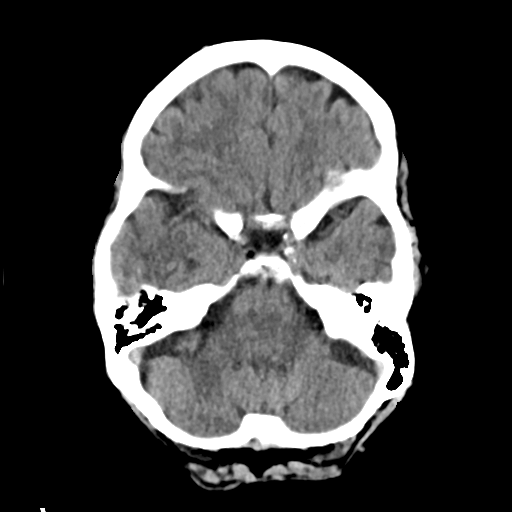
[im 12/32  brain]
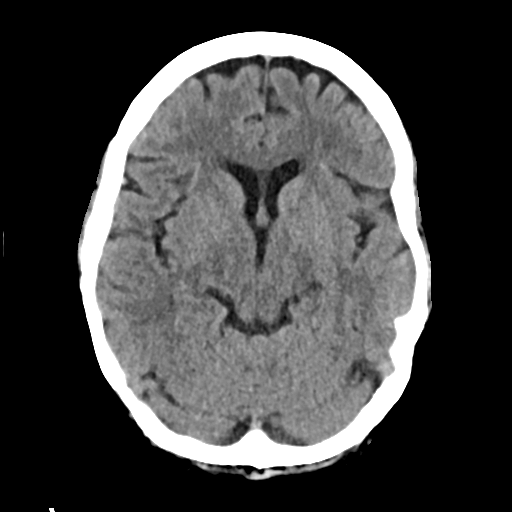
[im 16/32  brain]
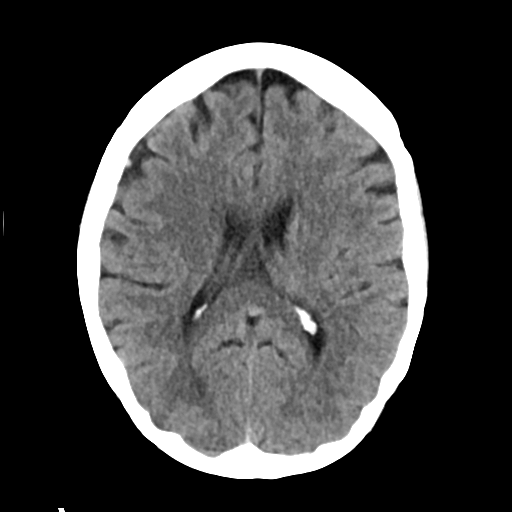
[im 20/32  brain]
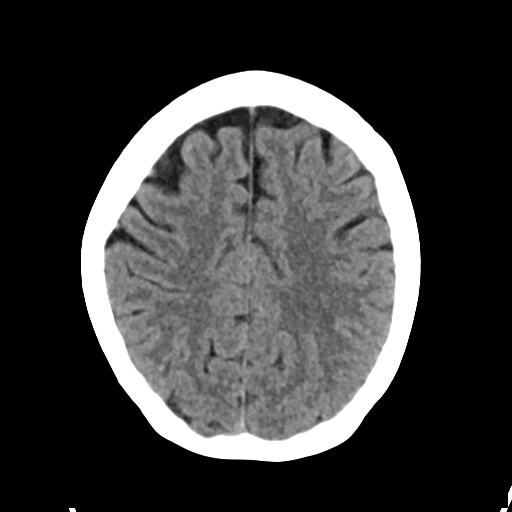
[im 20/32  bone]
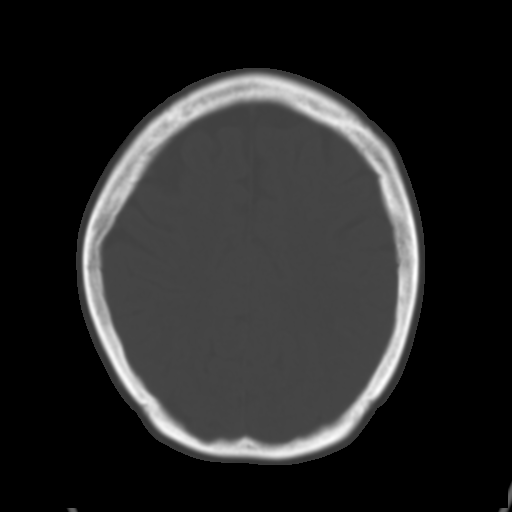
[im 24/32  brain]
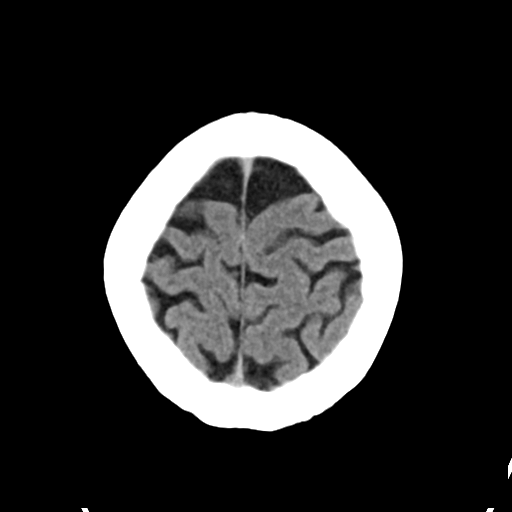
[im 28/32  brain]
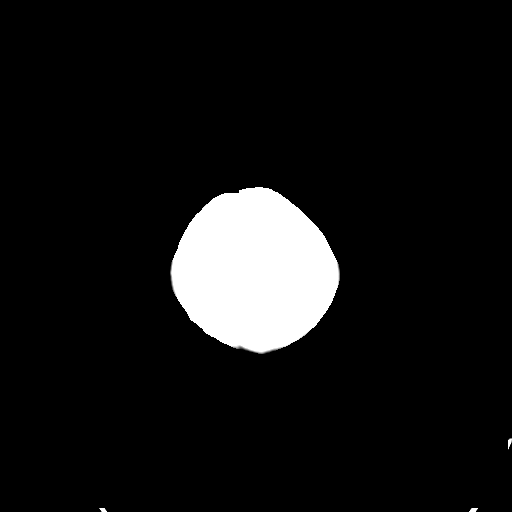

[Series 4: head bone · axial · 0.39mm/px · z∈[-143,-87]mm · 4 of 80 slices shown]
[im 8/80  bone]
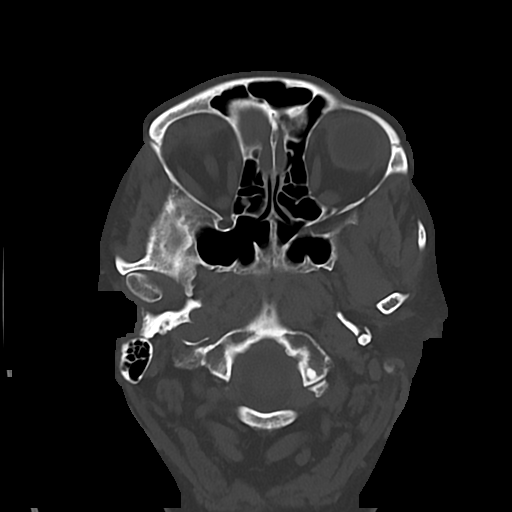
[im 16/80  bone]
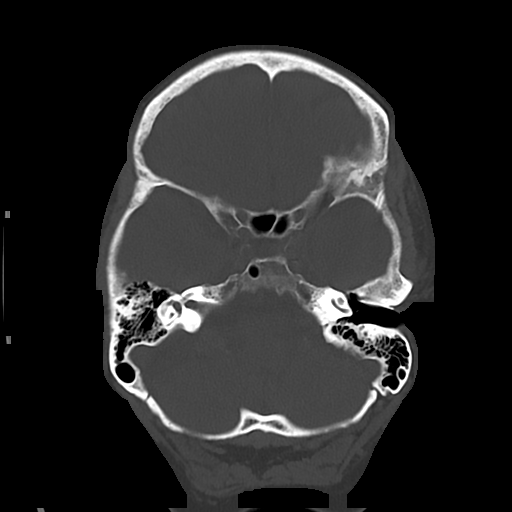
[im 24/80  bone]
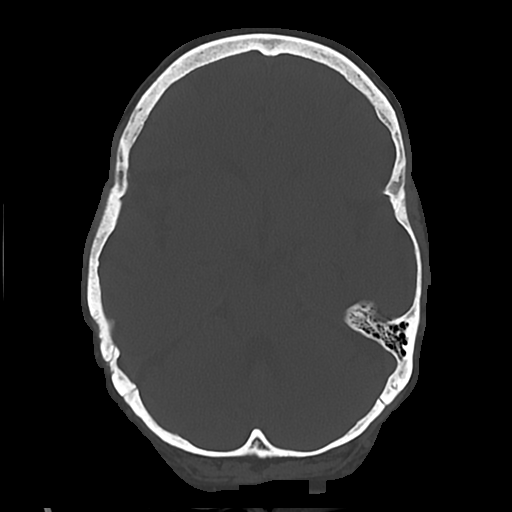
[im 36/80  bone]
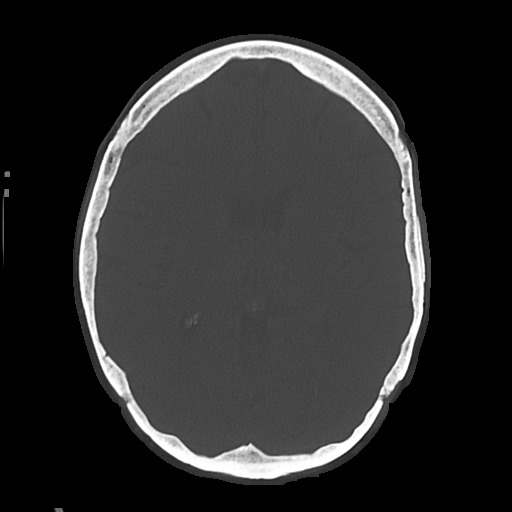

[Series 5: cor soft · coronal · 0.33mm/px · 3 of 64 slices shown]
[im 22/64  brain]
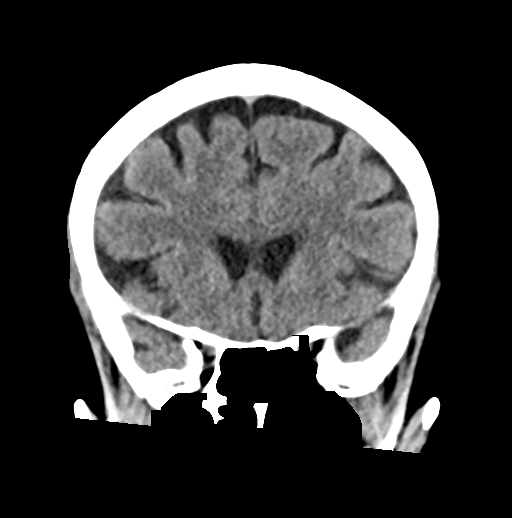
[im 29/64  brain]
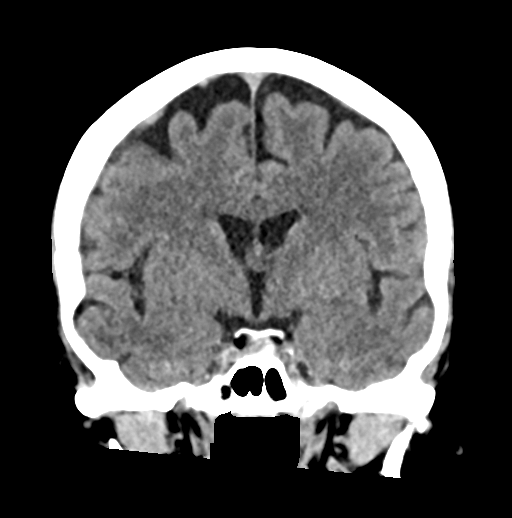
[im 36/64  brain]
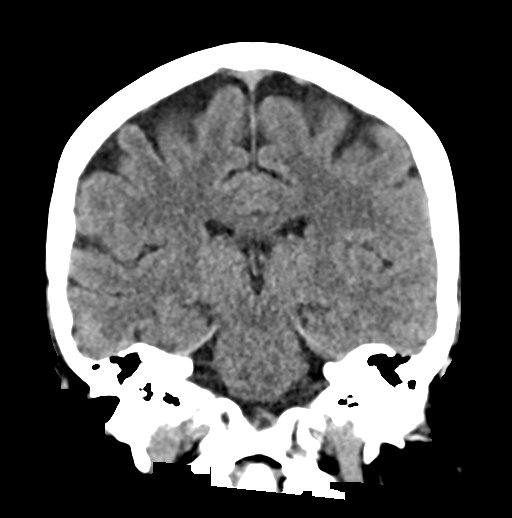

[Series 6: sag soft · sagittal · 0.34mm/px · 3 of 57 slices shown]
[im 19/57  brain]
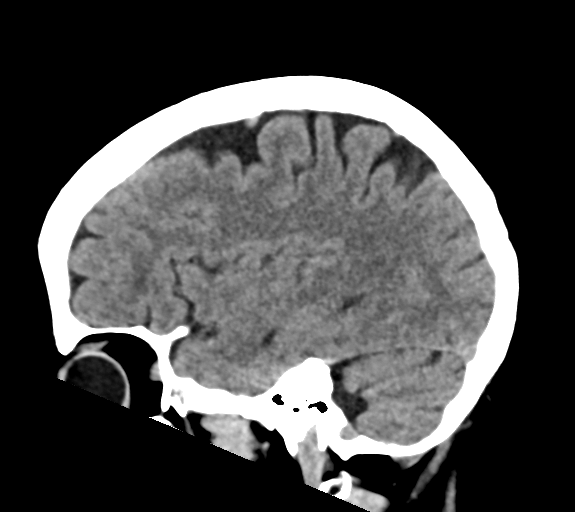
[im 29/57  brain]
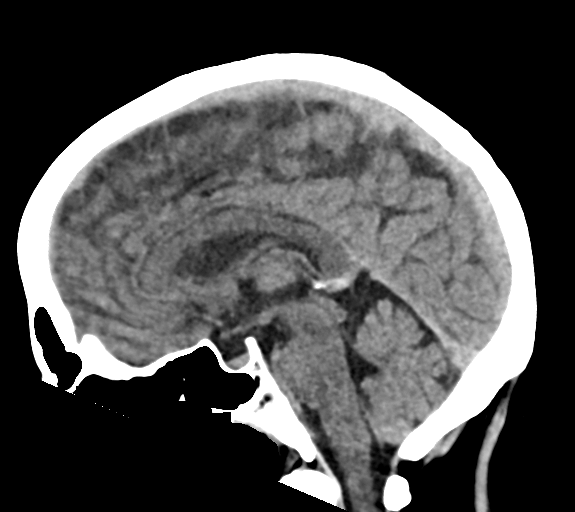
[im 38/57  brain]
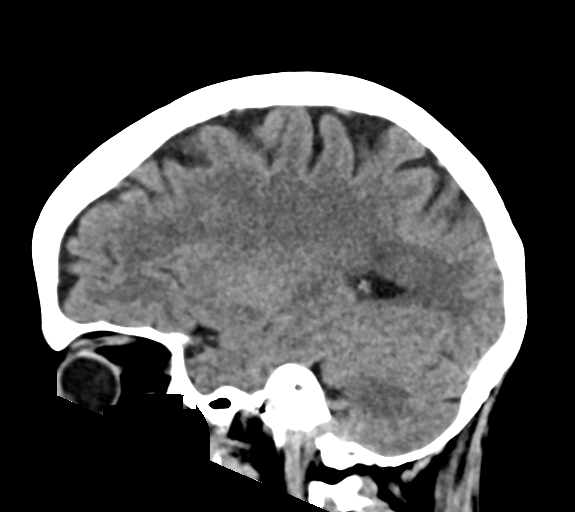

[17 of 47 positions shown; findings below may reference images not displayed]

FINDINGS: Brain: No evidence of acute infarction, hemorrhage, hydrocephalus,
extra-axial collection or mass lesion/mass effect.

Vascular: No hyperdense vessel or unexpected calcification.

Skull: Normal. Negative for fracture or focal lesion.

Sinuses/Orbits: No acute finding.

Other: None.
IMPRESSION: No acute intracranial abnormality noted.

## 2021-11-27 DIAGNOSIS — Z131 Encounter for screening for diabetes mellitus: Secondary | ICD-10-CM | POA: Insufficient documentation

## 2021-11-27 DIAGNOSIS — Z13220 Encounter for screening for lipoid disorders: Secondary | ICD-10-CM | POA: Insufficient documentation

## 2022-02-13 ENCOUNTER — Other Ambulatory Visit: Payer: Self-pay | Admitting: Gastroenterology

## 2022-02-14 DIAGNOSIS — Z5181 Encounter for therapeutic drug level monitoring: Secondary | ICD-10-CM | POA: Insufficient documentation

## 2022-02-14 DIAGNOSIS — R7303 Prediabetes: Secondary | ICD-10-CM | POA: Insufficient documentation

## 2022-02-14 DIAGNOSIS — Z79899 Other long term (current) drug therapy: Secondary | ICD-10-CM | POA: Insufficient documentation

## 2022-04-20 ENCOUNTER — Other Ambulatory Visit: Payer: Self-pay | Admitting: Gastroenterology

## 2022-05-16 DIAGNOSIS — Z1389 Encounter for screening for other disorder: Secondary | ICD-10-CM | POA: Insufficient documentation

## 2022-05-16 DIAGNOSIS — Z0100 Encounter for examination of eyes and vision without abnormal findings: Secondary | ICD-10-CM | POA: Insufficient documentation

## 2022-05-29 DIAGNOSIS — R61 Generalized hyperhidrosis: Secondary | ICD-10-CM | POA: Insufficient documentation

## 2022-05-29 DIAGNOSIS — G90519 Complex regional pain syndrome I of unspecified upper limb: Secondary | ICD-10-CM | POA: Insufficient documentation

## 2022-05-29 DIAGNOSIS — M79605 Pain in left leg: Secondary | ICD-10-CM | POA: Insufficient documentation

## 2022-05-29 DIAGNOSIS — G90529 Complex regional pain syndrome I of unspecified lower limb: Secondary | ICD-10-CM | POA: Insufficient documentation

## 2022-05-29 DIAGNOSIS — M79604 Pain in right leg: Secondary | ICD-10-CM | POA: Insufficient documentation

## 2022-05-29 DIAGNOSIS — R252 Cramp and spasm: Secondary | ICD-10-CM | POA: Insufficient documentation

## 2023-02-10 DIAGNOSIS — Z0001 Encounter for general adult medical examination with abnormal findings: Secondary | ICD-10-CM | POA: Insufficient documentation

## 2023-02-10 DIAGNOSIS — Z1339 Encounter for screening examination for other mental health and behavioral disorders: Secondary | ICD-10-CM | POA: Insufficient documentation

## 2023-02-10 DIAGNOSIS — F331 Major depressive disorder, recurrent, moderate: Secondary | ICD-10-CM | POA: Insufficient documentation

## 2023-02-10 DIAGNOSIS — Z789 Other specified health status: Secondary | ICD-10-CM | POA: Insufficient documentation

## 2023-02-10 DIAGNOSIS — Z6828 Body mass index (BMI) 28.0-28.9, adult: Secondary | ICD-10-CM | POA: Insufficient documentation

## 2023-02-10 DIAGNOSIS — E785 Hyperlipidemia, unspecified: Secondary | ICD-10-CM | POA: Insufficient documentation

## 2023-05-21 DIAGNOSIS — Z23 Encounter for immunization: Secondary | ICD-10-CM | POA: Insufficient documentation

## 2023-05-21 DIAGNOSIS — Z136 Encounter for screening for cardiovascular disorders: Secondary | ICD-10-CM | POA: Insufficient documentation

## 2023-05-21 DIAGNOSIS — R251 Tremor, unspecified: Secondary | ICD-10-CM | POA: Insufficient documentation

## 2023-05-28 ENCOUNTER — Encounter: Payer: Self-pay | Admitting: Neurology

## 2023-05-28 ENCOUNTER — Ambulatory Visit (INDEPENDENT_AMBULATORY_CARE_PROVIDER_SITE_OTHER): Payer: Medicare Other | Admitting: Neurology

## 2023-05-28 VITALS — BP 160/91 | HR 67 | Ht 62.0 in | Wt 163.5 lb

## 2023-05-28 DIAGNOSIS — R251 Tremor, unspecified: Secondary | ICD-10-CM | POA: Diagnosis not present

## 2023-05-28 MED ORDER — PROPRANOLOL HCL ER 60 MG PO CP24: 60.0000 mg | ORAL_CAPSULE | Freq: Every day | ORAL | 5 refills | Status: DC

## 2023-05-28 NOTE — Progress Notes (Signed)
[facility]  PATIENT: [name] [name] DOB: 08/13/1955  REFERRING DOCTOR OR PCP: Drusilla Kanner, NP SOURCE: Patient, notes from primary care,  _________________________________   HISTORICAL  CHIEF COMPLAINT:  Chief Complaint  Patient presents with   New Patient (Initial Visit)    Pt in room 11. Friend in room. New patient here for tremors. Pt said tremor started earlier this year, only in right hand.  No numbness or tingling.  Patient blood pressure elevated x 2.    HISTORY OF PRESENT ILLNESS:  I had the pleasure seeing your patient, [name] [name], at [facility] for neurologic consultation regarding her tremors.  She is a 67 year old woman who began to note tremors in her right hand over the last year.  They have progressed.  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    She denies weakness.  She has sone tingling in her index finger that has ben present since she had a hand injury and surgery around 11 years ago (cut on metal).      She denies change in gait.  Handwriting is unchanged.    It does not seem to be positional.    She does not necessarily note more with intention.    No change in facial features or bradykinesia  She is otherwise healthy.  A few years back she was having a lot of difficulties with mood and was on several medications.  Most have been discontinued.  She remains onVraylar but has been on it long term.   She may try to taper off as mood issues much better than in past.     She has no family history of tremor or other neurologic dysfunction.   Data: TSH and other labs were fine 01/2023  REVIEW OF SYSTEMS: Constitutional: No fevers, chills, sweats, or change in appetite Eyes: No visual changes, double vision, eye pain Ear, nose and throat: No hearing loss, ear pain, nasal congestion, sore throat Cardiovascular: No chest pain, palpitations Respiratory:  No shortness of breath at rest or with exertion.   No  wheezes GastrointestinaI: No nausea, vomiting, diarrhea, abdominal pain, fecal incontinence Genitourinary:  No dysuria, urinary retention or frequency.  No nocturia. Musculoskeletal:  No neck pain, back pain Integumentary: No rash, pruritus, skin lesions Neurological: as above Psychiatric: No depression at this time.  No anxiety Endocrine: No palpitations, diaphoresis, change in appetite, change in weigh or increased thirst Hematologic/Lymphatic:  No anemia, purpura, petechiae. Allergic/Immunologic: No itchy/runny eyes, nasal congestion, recent allergic reactions, rashes  ALLERGIES: Allergies  Allergen Reactions   Grass Extracts [Gramineae Pollens]     HOME MEDICATIONS:  Current Outpatient Medications:    acyclovir (ZOVIRAX) 400 MG tablet, 1 tablet, Disp: , Rfl:    cariprazine (VRAYLAR) 1.5 MG capsule, 1 capsule, Disp: , Rfl:    cholecalciferol (VITAMIN D3) 25 MCG (1000 UNIT) tablet, Take 1,000 Units by mouth daily., Disp: , Rfl:    fexofenadine (ALLEGRA) 180 MG tablet, Take 180 mg by mouth daily., Disp: , Rfl:    pantoprazole (PROTONIX) 20 MG tablet, Take 1 tablet (20 mg total) by mouth daily. Please call [phone] to schedule an office visit for more refills, Disp: 30 tablet, Rfl: 3   propranolol ER (INDERAL LA) 60 MG 24 hr capsule, Take 1 capsule (60 mg total) by mouth daily., Disp: 30 capsule, Rfl: 5   rosuvastatin (CRESTOR) 5 MG tablet, Take 5 mg by mouth daily., Disp: , Rfl:    albuterol (VENTOLIN HFA) 108 (  90 Base) MCG/ACT inhaler, SMARTSIG:2 Puff(s) By Mouth Every 6 Hours (Patient not taking: Reported on 05/28/2023), Disp: , Rfl:    ALPRAZolam (XANAX) 1 MG tablet, Take 0.5 mg by mouth 2 (two) times daily. (Patient not taking: Reported on 05/28/2023), Disp: , Rfl:    azelastine (OPTIVAR) 0.05 % ophthalmic solution, Apply to eye. (Patient not taking: Reported on 05/28/2023), Disp: , Rfl:    betaxolol (BETOPTIC-S) 0.5 % ophthalmic suspension, 1 drop into affected eye (Patient  not taking: Reported on 05/28/2023), Disp: , Rfl:    cetirizine (ZYRTEC) 10 MG tablet, Take 10 mg by mouth daily. (Patient not taking: Reported on 05/28/2023), Disp: , Rfl:    Cholecalciferol (VITAMIN D3) 1000 units CAPS, Take 1,000 Units by mouth daily at 10 pm. (Patient not taking: Reported on 05/28/2023), Disp: , Rfl:    dicyclomine (BENTYL) 10 MG capsule, TAKE 2 CAPSULES (20 MG TOTAL) BY MOUTH 2 (TWO) TIMES DAILY. (Patient not taking: Reported on 05/28/2023), Disp: 360 capsule, Rfl: 1   diphenoxylate-atropine (LOMOTIL) 2.5-0.025 MG tablet, as needed. (Patient not taking: Reported on 05/28/2023), Disp: , Rfl:    Dupilumab (DUPIXENT) 300 MG/2ML SOPN, See admin instructions. (Patient not taking: Reported on 05/28/2023), Disp: , Rfl:    EPINEPHrine 0.3 mg/0.3 mL IJ SOAJ injection, , Disp: , Rfl:    escitalopram (LEXAPRO) 20 MG tablet, Take 30 mg by mouth at bedtime. Takes 1.5 qhs (Patient not taking: Reported on 05/28/2023), Disp: , Rfl:    fluorometholone (FML) 0.1 % ophthalmic suspension, SMARTSIG:2 Drop(s) In Eye(s) Daily (Patient not taking: Reported on 05/28/2023), Disp: , Rfl:    fluticasone (FLONASE) 50 MCG/ACT nasal spray, 1 spray in each nostril (Patient not taking: Reported on 05/28/2023), Disp: , Rfl:    meloxicam (MOBIC) 15 MG tablet, TAKE 1 TABLET BY MOUTH ONCE A DAY (Patient not taking: Reported on 05/28/2023), Disp: , Rfl:    Olopatadine HCl 0.2 % SOLN, Apply 1 drop to eye daily. (Patient not taking: Reported on 05/28/2023), Disp: , Rfl:    OXcarbazepine (TRILEPTAL) 150 MG tablet, One po qAM and two po qHS (Patient not taking: Reported on 05/28/2023), Disp: 90 tablet, Rfl: 5   PREMARIN vaginal cream, Place 1 Applicatorful vaginally at bedtime. (Patient not taking: Reported on 05/28/2023), Disp: , Rfl:    tacrolimus (PROTOPIC) 0.1 % ointment, 1 application (Patient not taking: Reported on 05/28/2023), Disp: , Rfl:    vortioxetine HBr (TRINTELLIX) 20 MG TABS tablet, 1 tablet (Patient not  taking: Reported on 05/28/2023), Disp: , Rfl:   PAST MEDICAL HISTORY: Past Medical History:  Diagnosis Date   Allergy    Arthritis    Blood transfusion without reported diagnosis    Generalized anxiety disorder    Hyperlipidemia    IBS (irritable bowel syndrome)    Major depressive disorder    Psoriasis     PAST SURGICAL HISTORY: Past Surgical History:  Procedure Laterality Date   COLONOSCOPY  12/23/2017   DILATION AND CURETTAGE OF UTERUS     ESOPHAGOGASTRODUODENOSCOPY  04/22/2017   Erosive gastrtis. Otherwise normal EGD.    MASS EXCISION  09/11/2011   Procedure: EXCISION MASS;  Surgeon: Wyn Forster., MD;  Location: [facility];  Service: Orthopedics;  Laterality: Right;  excisional biopsy right hand     FAMILY HISTORY: Family History  Problem Relation Age of Onset   Cancer Mother    COPD Father    Colon polyps Brother    Stroke Brother    Colon cancer  Neg Hx    Stomach cancer Neg Hx    Esophageal cancer Neg Hx    Rectal cancer Neg Hx     SOCIAL HISTORY: Social History   Socioeconomic History   Marital status: Significant Other    Spouse name: Pat   Number of children: Not on file   Years of education: 12   Highest education level: High school graduate  Occupational History   Occupation: Human resources officer  Tobacco Use   Smoking status: Never   Smokeless tobacco: Never  Vaping Use   Vaping status: Never Used  Substance and Sexual Activity   Alcohol use: Yes    Alcohol/week: 14.0 standard drinks of alcohol    Types: 14 Cans of beer per week    Comment: SOCIAL   Drug use: No   Sexual activity: Yes  Other Topics Concern   Not on file  Social History Narrative   Right handed   Drinks 1 soda per day    Highest level of edu- 12th grade      Lives in two story home. Stays downstairs mainly.   Social Drivers of Corporate investment banker Strain: Not on file  Food Insecurity: Not on file  Transportation Needs: Not on file  Physical  Activity: Not on file  Stress: Not on file  Social Connections: Not on file  Intimate Partner Violence: Not on file       PHYSICAL EXAM  Vitals:   05/28/23 1000 05/28/23 1007  BP: (!) 157/91 (!) 160/91  Pulse: 67   Weight: 163 lb 8 oz (74.2 kg)   Height: 5\' 2"  (1.575 m)     Body mass index is 29.9 kg/m.   General: The patient is well-developed and well-nourished and in no acute distress  HEENT:  Head is [place]/AT.  Sclera are anicteric.    Neck: No carotid bruits are noted.  The neck is nontender.  Cardiovascular: The heart has a regular rate and rhythm with a normal S1 and S2. There were no murmurs, gallops or rubs.    Skin: Extremities are without rash or  edema.  Musculoskeletal:  Back is nontender  Neurologic Exam  Mental status: The patient is alert and oriented x 3 at the time of the examination. The patient has apparent normal recent and remote memory, with an apparently normal attention span and concentration ability.   Speech is normal.  Cranial nerves: Extraocular movements are full.   There is good facial sensation to soft touch bilaterally.Facial strength is normal.  Trapezius and sternocleidomastoid strength is normal. No dysarthria is noted.  The tongue is midline, and the patient has symmetric elevation of the soft palate. No obvious hearing deficits are noted.  Motor: No bradykinesia.  Muscle bulk is normal.   Tone is normal. Strength is  5 / 5 in all 4 extremities.   Sensory: Sensory testing is intact to pinprick, soft touch and vibration sensation in all 4 extremities.  Coordination: Cerebellar testing reveals good finger-nose-finger and heel-to-shin bilaterally.  Gait and station: Station is normal.   Gait is normal. Tandem gait is normal.  There is no retropulsion.  Romberg is negative.   Reflexes: Deep tendon reflexes are symmetric and normal bilaterally.   Plantar responses are flexor.    DIAGNOSTIC DATA (LABS, IMAGING, TESTING) - I reviewed  patient records, labs, notes, testing and imaging myself where available.  Lab Results  Component Value Date   WBC 6.5 02/08/2020   HGB 14.3 02/08/2020  HCT 42.0 02/08/2020   MCV 96.6 02/08/2020   PLT 271 02/08/2020      Component Value Date/Time   NA 142 02/08/2020 1422   K 4.0 02/08/2020 1422   CL 103 02/08/2020 1422   CO2 29 02/08/2020 1400   GLUCOSE 105 (H) 02/08/2020 1422   BUN 15 02/08/2020 1422   CREATININE 0.70 02/08/2020 1422   CALCIUM 9.9 02/08/2020 1400   PROT 7.4 02/08/2020 1400   ALBUMIN 4.1 02/08/2020 1400   AST 24 02/08/2020 1400   ALT 26 02/08/2020 1400   ALKPHOS 89 02/08/2020 1400   BILITOT 0.2 (L) 02/08/2020 1400   GFRNONAA >60 02/08/2020 1400   GFRAA >60 02/08/2020 1400       ASSESSMENT AND PLAN  Tremor  In summary, [name] [name] is a 67 year old woman who has had a 1 year history of tremor predominantly in the right hand.  She has not had other neurologic symptoms.  Specifically, there is no change in her gait, speech, facial features.  Therefore, I do not think that this represents Parkinson's disease.  Although she does not have perfect features for essential tremor, I will have her do a trial of propranolol to see if that helps her symptoms.    If she begins to have difficulties with gait or other symptoms worrisome for Parkinson disease, we will do a trial of Sinemet.  She will return in 6 months for an evaluation or sooner if there are more rapid changes or new symptoms.  Thank you for asking me to see [name] [name].  Please let me know if I can be of further assistance with her or other patients in the future.   Aseneth Hack A. Epimenio Foot, MD, [facility] 05/28/2023, 10:54 AM Certified in Neurology, Clinical Neurophysiology, Sleep Medicine and Neuroimaging  [facility] [address], [address] [place], [place] [address] [phone]

## 2023-06-01 ENCOUNTER — Telehealth: Payer: Self-pay | Admitting: Neurology

## 2023-06-01 NOTE — Telephone Encounter (Signed)
   Will call pharmacy one they open to see if they received Rx. Per note 05/28/23 " I will have her do a trial of propranolol to see if that helps her symptoms. If she begins to have difficulties with gait or other symptoms worrisome for Parkinson disease, we will do a trial of Sinemet. "

## 2023-06-01 NOTE — Telephone Encounter (Signed)
Pt states she was told at her visit last week that a RX would be sent to  [facility] [phone]. The pharmacy states it is not there and she does not remember the name of the medication being it was something new

## 2023-06-01 NOTE — Telephone Encounter (Signed)
I called [facility] pharmacy and was told patient picked up Rx for propranolol on 05/19/23 #90 day supply. I called patient and told her that Rx was picked this date. Pt will double check her Rx's and call back if needed.

## 2023-06-01 NOTE — Telephone Encounter (Signed)
Pt has called to report that she has been on  propranolol ER (INDERAL LA) 60 MG 24 hr capsule, from a previous provider.  Pt is asking for a call to discuss being put on another medication for her right hand tremor by Dr Epimenio Foot.

## 2023-06-02 MED ORDER — NADOLOL 20 MG PO TABS: 20.0000 mg | ORAL_TABLET | Freq: Every day | ORAL | 3 refills | Status: DC

## 2023-06-02 NOTE — Addendum Note (Signed)
Addended by: Aura Camps on: 06/02/2023 12:27 PM   Modules accepted: Orders

## 2023-06-02 NOTE — Telephone Encounter (Addendum)
Patient informed Dr.Sater said to try  We can have her try nadolol 20 mg instead.    #90   1 p.o. daily   3 refills    Patient informed, Rx sent.

## 2023-06-02 NOTE — Telephone Encounter (Addendum)
Dr. Epimenio Foot please see the below. Per note on 05/28/23 "I will have her do a trial of propranolol to see if that helps her symptoms. If she begins to have difficulties with gait or other symptoms worrisome for Parkinson disease, we will do a trial of Sinemet."  Do you have any other recommendations? Please advise

## 2023-10-23 DIAGNOSIS — M6788 Other specified disorders of synovium and tendon, other site: Secondary | ICD-10-CM | POA: Insufficient documentation

## 2023-10-23 DIAGNOSIS — M25571 Pain in right ankle and joints of right foot: Secondary | ICD-10-CM | POA: Insufficient documentation

## 2023-11-23 DIAGNOSIS — M898X9 Other specified disorders of bone, unspecified site: Secondary | ICD-10-CM | POA: Insufficient documentation

## 2023-11-24 DIAGNOSIS — E559 Vitamin D deficiency, unspecified: Secondary | ICD-10-CM | POA: Insufficient documentation

## 2023-11-24 DIAGNOSIS — M179 Osteoarthritis of knee, unspecified: Secondary | ICD-10-CM | POA: Insufficient documentation

## 2023-11-24 DIAGNOSIS — H6093 Unspecified otitis externa, bilateral: Secondary | ICD-10-CM | POA: Insufficient documentation

## 2023-11-24 DIAGNOSIS — J309 Allergic rhinitis, unspecified: Secondary | ICD-10-CM | POA: Insufficient documentation

## 2023-11-26 DIAGNOSIS — R200 Anesthesia of skin: Secondary | ICD-10-CM | POA: Insufficient documentation

## 2023-11-26 DIAGNOSIS — L409 Psoriasis, unspecified: Secondary | ICD-10-CM | POA: Insufficient documentation

## 2023-12-08 DIAGNOSIS — M7660 Achilles tendinitis, unspecified leg: Secondary | ICD-10-CM | POA: Insufficient documentation

## 2023-12-08 DIAGNOSIS — M6780 Other specified disorders of synovium and tendon, unspecified site: Secondary | ICD-10-CM | POA: Insufficient documentation

## 2023-12-17 ENCOUNTER — Encounter: Payer: Self-pay | Admitting: Neurology

## 2023-12-17 ENCOUNTER — Ambulatory Visit: Payer: Medicare Other | Admitting: Neurology

## 2023-12-17 VITALS — BP 149/80 | HR 69 | Ht 62.0 in | Wt 155.5 lb

## 2023-12-17 DIAGNOSIS — R251 Tremor, unspecified: Secondary | ICD-10-CM | POA: Diagnosis not present

## 2023-12-17 MED ORDER — NADOLOL 20 MG PO TABS: 20.0000 mg | ORAL_TABLET | Freq: Every day | ORAL | 3 refills | Status: AC

## 2023-12-17 NOTE — Progress Notes (Signed)
 [facility]  PATIENT: [name] [name] DOB: Mar 31, 1956  REFERRING DOCTOR OR PCP: Angeline Iba, NP SOURCE: Patient, notes from primary care,  _________________________________   HISTORICAL  CHIEF COMPLAINT:  Chief Complaint  Patient presents with   RM10/TREMOR    Pt is here with her Husband. Pt states that with her Nadolo 20 mg her tremors are gone. Pt would like to discuss medication.     HISTORY OF PRESENT ILLNESS:   [name] [name] is a 68 y.o. woman with tremors.   Update 12/17/2023: At the last visit nadolol  was prescribed for her presumptive diagnosis of BET.    She feels that this has helped.  She notes a dry mouth, unsure if temporally related to the nadolol .  No lightheadedness.    She notes no decline in gait or balance.  She began to note tremors in her right hand  in early 2024.   They have progressed.  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    She denies weakness.  She has sone tingling in her index finger that has ben present since she had a hand injury and surgery around 11 years ago (cut on metal).      She denies change in gait.  Handwriting is unchanged.    It does not seem to be positional.    She does not necessarily note more with intention.    No change in facial features or bradykinesia  She is otherwise healthy.  She has a history of depression but feels that this is stable and better than a few years ago.    She has no family history of tremor or other neurologic dysfunction.   Data: TSH and other labs were fine 01/2023  REVIEW OF SYSTEMS: Constitutional: No fevers, chills, sweats, or change in appetite Eyes: No visual changes, double vision, eye pain Ear, nose and throat: No hearing loss, ear pain, nasal congestion, sore throat Cardiovascular: No chest pain, palpitations Respiratory:  No shortness of breath at rest or with exertion.   No wheezes GastrointestinaI: No nausea, vomiting, diarrhea, abdominal pain, fecal  incontinence Genitourinary:  No dysuria, urinary retention or frequency.  No nocturia. Musculoskeletal:  No neck pain, back pain Integumentary: No rash, pruritus, skin lesions Neurological: as above Psychiatric: No depression at this time.  No anxiety Endocrine: No palpitations, diaphoresis, change in appetite, change in weigh or increased thirst Hematologic/Lymphatic:  No anemia, purpura, petechiae. Allergic/Immunologic: No itchy/runny eyes, nasal congestion, recent allergic reactions, rashes  ALLERGIES: No Active Allergies   HOME MEDICATIONS:  Current Outpatient Medications:    acyclovir (ZOVIRAX) 400 MG tablet, 1 tablet, Disp: , Rfl:    cetirizine (ZYRTEC) 10 MG tablet, Take 10 mg by mouth daily., Disp: , Rfl:    cholecalciferol (VITAMIN D3) 25 MCG (1000 UNIT) tablet, Take 1,000 Units by mouth daily., Disp: , Rfl:    fluticasone (FLONASE) 50 MCG/ACT nasal spray, 1 spray in each nostril, Disp: , Rfl:    pantoprazole (PROTONIX) 20 MG tablet, Take 1 tablet (20 mg total) by mouth daily. Please call [phone] to schedule an office visit for more refills, Disp: 30 tablet, Rfl: 3   rosuvastatin (CRESTOR) 5 MG tablet, Take 5 mg by mouth daily., Disp: , Rfl:    albuterol (VENTOLIN HFA) 108 (90 Base) MCG/ACT inhaler, SMARTSIG:2 Puff(s) By Mouth Every 6 Hours (Patient not taking: Reported on 12/17/2023), Disp: , Rfl:    ALPRAZolam (XANAX) 1 MG tablet, Take 0.5 mg by mouth 2 (two) times daily. (  Patient not taking: Reported on 12/17/2023), Disp: , Rfl:    azelastine (OPTIVAR) 0.05 % ophthalmic solution, Apply to eye. (Patient not taking: Reported on 12/17/2023), Disp: , Rfl:    betaxolol (BETOPTIC-S) 0.5 % ophthalmic suspension, 1 drop into affected eye (Patient not taking: Reported on 12/17/2023), Disp: , Rfl:    cariprazine (VRAYLAR) 1.5 MG capsule, 1 capsule (Patient not taking: Reported on 12/17/2023), Disp: , Rfl:    Cholecalciferol (VITAMIN D3) 1000 units CAPS, Take 1,000 Units by mouth daily at 10  pm. (Patient not taking: Reported on 12/17/2023), Disp: , Rfl:    dicyclomine  (BENTYL ) 10 MG capsule, TAKE 2 CAPSULES (20 MG TOTAL) BY MOUTH 2 (TWO) TIMES DAILY. (Patient not taking: Reported on 12/17/2023), Disp: 360 capsule, Rfl: 1   diphenoxylate -atropine  (LOMOTIL ) 2.5-0.025 MG tablet, as needed. (Patient not taking: Reported on 12/17/2023), Disp: , Rfl:    Dupilumab (DUPIXENT) 300 MG/2ML SOPN, See admin instructions. (Patient not taking: Reported on 12/17/2023), Disp: , Rfl:    EPINEPHrine 0.3 mg/0.3 mL IJ SOAJ injection, , Disp: , Rfl:    escitalopram (LEXAPRO) 20 MG tablet, Take 30 mg by mouth at bedtime. Takes 1.5 qhs (Patient not taking: Reported on 12/17/2023), Disp: , Rfl:    fexofenadine (ALLEGRA) 180 MG tablet, Take 180 mg by mouth daily. (Patient not taking: Reported on 12/17/2023), Disp: , Rfl:    fluorometholone (FML) 0.1 % ophthalmic suspension, SMARTSIG:2 Drop(s) In Eye(s) Daily (Patient not taking: Reported on 12/17/2023), Disp: , Rfl:    meloxicam (MOBIC) 15 MG tablet, TAKE 1 TABLET BY MOUTH ONCE A DAY (Patient not taking: Reported on 12/17/2023), Disp: , Rfl:    nadolol  (CORGARD ) 20 MG tablet, Take 1 tablet (20 mg total) by mouth daily., Disp: 90 tablet, Rfl: 3   Olopatadine HCl 0.2 % SOLN, Apply 1 drop to eye daily. (Patient not taking: Reported on 12/17/2023), Disp: , Rfl:    OXcarbazepine  (TRILEPTAL ) 150 MG tablet, One po qAM and two po qHS (Patient not taking: Reported on 12/17/2023), Disp: 90 tablet, Rfl: 5   PREMARIN vaginal cream, Place 1 Applicatorful vaginally at bedtime. (Patient not taking: Reported on 12/17/2023), Disp: , Rfl:    tacrolimus (PROTOPIC) 0.1 % ointment, 1 application (Patient not taking: Reported on 12/17/2023), Disp: , Rfl:    vortioxetine HBr (TRINTELLIX) 20 MG TABS tablet, 1 tablet (Patient not taking: Reported on 12/17/2023), Disp: , Rfl:   PAST MEDICAL HISTORY: Past Medical History:  Diagnosis Date   Allergy    Arthritis    Blood transfusion without reported diagnosis     Generalized anxiety disorder    Hyperlipidemia    IBS (irritable bowel syndrome)    Major depressive disorder    Psoriasis     PAST SURGICAL HISTORY: Past Surgical History:  Procedure Laterality Date   COLONOSCOPY  12/23/2017   DILATION AND CURETTAGE OF UTERUS     ESOPHAGOGASTRODUODENOSCOPY  04/22/2017   Erosive gastrtis. Otherwise normal EGD.    MASS EXCISION  09/11/2011   Procedure: EXCISION MASS;  Surgeon: Lamar LULLA Leonor Mickey., MD;  Location: [facility];  Service: Orthopedics;  Laterality: Right;  excisional biopsy right hand     FAMILY HISTORY: Family History  Problem Relation Age of Onset   Cancer Mother    COPD Father    Colon polyps Brother    Stroke Brother    Colon cancer Neg Hx    Stomach cancer Neg Hx    Esophageal cancer Neg Hx    Rectal  cancer Neg Hx     SOCIAL HISTORY: Social History   Socioeconomic History   Marital status: Significant Other    Spouse name: Pat   Number of children: Not on file   Years of education: 12   Highest education level: High school graduate  Occupational History   Occupation: Human resources officer  Tobacco Use   Smoking status: Never   Smokeless tobacco: Never  Vaping Use   Vaping status: Never Used  Substance and Sexual Activity   Alcohol use: Yes    Alcohol/week: 14.0 standard drinks of alcohol    Types: 14 Cans of beer per week    Comment: SOCIAL   Drug use: No   Sexual activity: Yes  Other Topics Concern   Not on file  Social History Narrative   Right handed   Drinks 1 soda per day    Highest level of edu- 12th grade      Lives in two story home. Stays downstairs mainly.   Social Drivers of Corporate investment banker Strain: Not on file  Food Insecurity: Not on file  Transportation Needs: Not on file  Physical Activity: Not on file  Stress: Not on file  Social Connections: Not on file  Intimate Partner Violence: Not on file       PHYSICAL EXAM  Vitals:   12/17/23 1053  BP: (!)  149/80  Pulse: 69  SpO2: 98%  Weight: 155 lb 8 oz (70.5 kg)  Height: 5' 2 (1.575 m)    Body mass index is 28.44 kg/m.   General: The patient is well-developed and well-nourished and in no acute distress  HEENT:  Head is Leslie/AT.  Sclera are anicteric.    Skin: Extremities are without rash or  edema.  Musculoskeletal:  Back is nontender  Neurologic Exam  Mental status: The patient is alert and oriented x 3 at the time of the examination. The patient has apparent normal recent and remote memory, with an apparently normal attention span and concentration ability.   Speech is normal.  Cranial nerves: Extraocular movements are full.   There is good facial sensation to soft touch bilaterally.Facial strength is normal.  Trapezius and sternocleidomastoid strength is normal. No dysarthria is noted. . No obvious hearing deficits are noted.  Motor: No bradykinesia.  No significant tremor today.  Schedule a spiral as well.  Muscle bulk is normal.   Tone is normal. Strength is  5 / 5 in all 4 extremities.   Sensory: Sensory testing is intact to pinprick, soft touch and vibration sensation in all 4 extremities.  Coordination: Cerebellar testing reveals good finger-nose-finger and heel-to-shin bilaterally.  Gait and station: Station is normal.   Gait is normal. Tandem gait is normal.  There is no retropulsion.  Romberg is negative.   Reflexes: Deep tendon reflexes are symmetric and normal bilaterally.      DIAGNOSTIC DATA (LABS, IMAGING, TESTING) - I reviewed patient records, labs, notes, testing and imaging myself where available.  Lab Results  Component Value Date   WBC 6.5 02/08/2020   HGB 14.3 02/08/2020   HCT 42.0 02/08/2020   MCV 96.6 02/08/2020   PLT 271 02/08/2020      Component Value Date/Time   NA 142 02/08/2020 1422   K 4.0 02/08/2020 1422   CL 103 02/08/2020 1422   CO2 29 02/08/2020 1400   GLUCOSE 105 (H) 02/08/2020 1422   BUN 15 02/08/2020 1422   CREATININE 0.70  02/08/2020 1422   CALCIUM 9.9  02/08/2020 1400   PROT 7.4 02/08/2020 1400   ALBUMIN 4.1 02/08/2020 1400   AST 24 02/08/2020 1400   ALT 26 02/08/2020 1400   ALKPHOS 89 02/08/2020 1400   BILITOT 0.2 (L) 02/08/2020 1400   GFRNONAA >60 02/08/2020 1400   GFRAA >60 02/08/2020 1400       ASSESSMENT AND PLAN  Tremor  She can continue the nadolol  for tremors.  However, since she is experiencing a dry mouth that may be related she will cut the dose down by half and consider stopping for a while to see if the tremors are bad enough to treat No evidence of Parkinson's disease.   Stay active and exercise as tolerated. Return as needed for new or worsening neurologic symptoms.  This visit is part of a comprehensive longitudinal care medical relationship regarding the patients primary diagnosis of tremor and related concerns.   Jahyra Sukup A. Vear, MD, [facility] 12/17/2023, 5:01 PM Certified in Neurology, Clinical Neurophysiology, Sleep Medicine and Neuroimaging  [facility] [address] [place], [place] [address] [phone]

## 2023-12-28 ENCOUNTER — Telehealth: Payer: Self-pay | Admitting: Neurology

## 2023-12-28 NOTE — Telephone Encounter (Signed)
 Adrien- have you received? I checked our pod and we do not have anything yet

## 2023-12-28 NOTE — Telephone Encounter (Signed)
 Pt called stating that her PCP sent over the report for her MRI and she is wanting to know if it has been received and read. Please advise.

## 2023-12-30 ENCOUNTER — Telehealth: Payer: Self-pay | Admitting: *Deleted

## 2023-12-30 NOTE — Telephone Encounter (Signed)
 Spoke with patient and she is aware that we have received the MRI results. Pending review from Dr.Sater.

## 2023-12-30 NOTE — Telephone Encounter (Signed)
 Pt mr head report in nurse pod.

## 2023-12-30 NOTE — Telephone Encounter (Signed)
Report below:

## 2023-12-30 NOTE — Telephone Encounter (Signed)
 Adrien, have you received anything? We do not have anything in the pod. If not, can you follow up on this? Pt calling again

## 2023-12-30 NOTE — Telephone Encounter (Signed)
 Called [phone] and LVM for her to call office.

## 2023-12-30 NOTE — Telephone Encounter (Signed)
 Duplicate, see 12/28/23 telephone note.

## 2023-12-30 NOTE — Telephone Encounter (Signed)
 Pt called to follow up on MRI   and to request to speak to nurse about  report .

## 2023-12-31 NOTE — Telephone Encounter (Signed)
 Called pt. Relayed results per Dr. Bonnita Hollow note. Pt verbalized understanding.

## 2023-12-31 NOTE — Addendum Note (Signed)
 Addended by: JOSHUA MAURILIO CROME on: 12/31/2023 03:12 PM   Modules accepted: Orders

## 2024-01-23 ENCOUNTER — Other Ambulatory Visit: Payer: Self-pay | Admitting: Neurology
